# Patient Record
Sex: Female | Born: 2000
Health system: Southern US, Community
[De-identification: ages and names within clinical notes are randomized; demographics above are authoritative.]

## PROBLEM LIST (undated history)

## (undated) DIAGNOSIS — F419 Anxiety disorder, unspecified: Secondary | ICD-10-CM

## (undated) DIAGNOSIS — F988 Other specified behavioral and emotional disorders with onset usually occurring in childhood and adolescence: Secondary | ICD-10-CM

## (undated) DIAGNOSIS — K5190 Ulcerative colitis, unspecified, without complications: Secondary | ICD-10-CM

## (undated) HISTORY — DX: Ulcerative colitis, unspecified, without complications: K51.90

## (undated) HISTORY — DX: Other specified behavioral and emotional disorders with onset usually occurring in childhood and adolescence: F98.8

## (undated) HISTORY — PX: WISDOM TOOTH EXTRACTION: SHX21

---
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
## 2019-01-18 MED FILL — AMPHETAMINE-DEXTROAMPHETAMI: 20 | 30 days supply | Qty: 60 | Fill #0

## 2019-01-18 MED FILL — ESCITALOPRAM 20 MG TABLET: 20 | 90 days supply | Qty: 90 | Fill #0

## 2019-04-25 MED FILL — ESCITALOPRAM 10 MG TABLET: 10 | 30 days supply | Qty: 30 | Fill #3

## 2019-05-13 ENCOUNTER — Encounter: Payer: Self-pay | Admitting: Physician Assistant

## 2019-05-13 ENCOUNTER — Other Ambulatory Visit: Payer: Self-pay

## 2019-05-13 ENCOUNTER — Ambulatory Visit: Payer: 59 | Admitting: Physician Assistant

## 2019-05-13 VITALS — BP 100/62 | HR 78 | Temp 98.1°F | Ht 65.0 in | Wt 129.0 lb

## 2019-05-13 DIAGNOSIS — B079 Viral wart, unspecified: Secondary | ICD-10-CM | POA: Diagnosis not present

## 2019-05-13 DIAGNOSIS — Z23 Encounter for immunization: Secondary | ICD-10-CM

## 2019-05-13 NOTE — Patient Instructions (Signed)
It was great to see you! ° °Cryosurgery post-operative instructions °You have been treated with “cryosurgery.” The treated area should be destroyed by this spray of liquid nitrogen. The care of the site should be as follows: ° °• Within 24 hours after freezing, a small blister may form at the site. This blister may be filled with either clear fluid or bloody fluid. If the blister is causing a great deal of pain, you should cleanse a needle or pin with rubbing alcohol and puncture the blister to drain it. If the blister is not painful, you should just it heal by itself. ° °• No special care other than washing the site with soap and water is necessary. The blister should resolve in approximately five to ten days leaving a small crust at the site. The crust should be allowed to come off on its own. Do not pick at or manipulate the crust. ° °• No bandage is typically needed after cryosurgery. However, if you have punctured a large, tender blister, application of Vaseline ointment and a band-aid may help with any discomfort. ° °• After four weeks if the treated lesion has not resolved, please make an appointment for follow-up evaluation. ° ° °Take care, ° °Adekunle Rohrbach PA-C ° °

## 2019-05-13 NOTE — Progress Notes (Signed)
Diana Perry is a 18 y.o. female here for a new problem.  I acted as a Education administrator for [employer], PA-C Anselmo Pickler, LPN  History of Present Illness:   Chief Complaint  Patient presents with  . Verrucous Vulgaris    HPI   Wart Pt c/o wart on 3 toes of her left foot, has been there for several months. Denies any prior issues with warts. Denies any use of public showers, history of other skin issues. She denies any concerns for fever, drainage of areas.  She has tried OTC wart remover and cryotherapy without improvement.  Past Medical History:  Diagnosis Date  . ADD (attention deficit disorder) 05/2018  . Anxiety      Social History   Socioeconomic History  . Marital status: Single    Spouse name: Not on file  . Number of children: Not on file  . Years of education: Not on file  . Highest education level: Not on file  Occupational History  . Not on file  Social Needs  . Financial resource strain: Not on file  . Food insecurity    Worry: Not on file    Inability: Not on file  . Transportation needs    Medical: Not on file    Non-medical: Not on file  Tobacco Use  . Smoking status: Former Smoker    Types: E-cigarettes    Quit date: 02/20/2018    Years since quitting: 1.2  . Smokeless tobacco: Never Used  Substance and Sexual Activity  . Alcohol use: Never    Frequency: Never  . Drug use: Never  . Sexual activity: Yes    Birth control/protection: Condom  Lifestyle  . Physical activity    Days per week: Not on file    Minutes per session: Not on file  . Stress: Not on file  Relationships  . Social Herbalist on phone: Not on file    Gets together: Not on file    Attends religious service: Not on file    Active member of club or organization: Not on file    Attends meetings of clubs or organizations: Not on file    Relationship status: Not on file  . Intimate partner violence    Fear of current or ex partner: Not on file    Emotionally  abused: Not on file    Physically abused: Not on file    Forced sexual activity: Not on file  Other Topics Concern  . Not on file  Social History Narrative   [employer]   Would like to go to [employer] for [employer]   Boyfriend    History reviewed. No pertinent surgical history.  Family History  Problem Relation Age of Onset  . BRCA 1/2 Mother   . Anxiety disorder Mother     Allergies  Allergen Reactions  . Other     apples    Current Medications:   Current Outpatient Medications:  .  Amphetamine-Dextroamphetamine (ADDERALL PO), Take by mouth., Disp: , Rfl:  .  FLUoxetine (PROZAC) 20 MG tablet, Take 20 mg by mouth daily., Disp: , Rfl:    Review of Systems:   ROS Negative unless otherwise specified per HPI.  Vitals:   Vitals:   05/13/19 1557  BP: 100/62  Pulse: 78  Temp: 98.1 F (36.7 C)  TempSrc: Temporal  SpO2: 97%  Weight: 129 lb (58.5 kg)  Height: '5\' 5"'$  (1.651 m)  Body mass index is 21.47 kg/m.  Physical Exam:   Physical Exam Constitutional:      Appearance: She is well-developed.  HENT:     Head: Normocephalic and atraumatic.  Eyes:     Conjunctiva/sclera: Conjunctivae normal.  Neck:     Musculoskeletal: Normal range of motion and neck supple.  Pulmonary:     Effort: Pulmonary effort is normal.  Musculoskeletal: Normal range of motion.  Skin:    General: Skin is warm and dry.     Comments: Multiple small verrocous skin-colored lesions to tip of 2nd and 3rd L foot toes. Also one lesion to dorsum of L great toe near proximal joint.  Neurological:     Mental Status: She is alert and oriented to person, place, and time.  Psychiatric:        Behavior: Behavior normal.        Thought Content: Thought content normal.        Judgment: Judgment normal.    Consent: Risks and benefits of therapy discussed with patient who voices understanding and agrees with planned care. No barriers to communication or understanding  identified. After obtaining informed consent, the patient's identity, procedure, and site were verified during a pause prior to proceeding with the minor surgical procedure as per universal protocol recommendations. After appropriate cleansing, liquid nitrogen was applied to approximately 10 warts on L foot.    Assessment and Plan:   [name] was seen today for verrucous vulgaris.  Diagnoses and all orders for this visit:  Viral warts, unspecified type Cryotherapy performed in office. Also updated her HPV vaccine, has had only one at age 59. Will repeat in 6 months. Worsening precautions and cryo after-care discussed.  Need for HPV vaccination -     HPV 9-valent vaccine,Recombinat  . Reviewed expectations re: course of current medical issues. . Discussed self-management of symptoms. . Outlined signs and symptoms indicating need for more acute intervention. . Patient verbalized understanding and all questions were answered. . See orders for this visit as documented in the electronic medical record. . Patient received an After-Visit Summary.  CMA or LPN served as scribe during this visit. History, Physical, and Plan performed by medical provider. The above documentation has been reviewed and is accurate and complete.   [name], PA-C

## 2019-06-16 ENCOUNTER — Ambulatory Visit (HOSPITAL_COMMUNITY)
Admission: EM | Admit: 2019-06-16 | Discharge: 2019-06-16 | Disposition: A | Discharge status: Home / Self Care | Payer: 59 | Attending: Emergency Medicine | Admitting: Emergency Medicine

## 2019-06-16 ENCOUNTER — Other Ambulatory Visit: Payer: Self-pay

## 2019-06-16 ENCOUNTER — Inpatient Hospital Stay: Admission: RE | Admit: 2019-06-16 | Payer: 59 | Source: Ambulatory Visit

## 2019-06-16 ENCOUNTER — Encounter (HOSPITAL_COMMUNITY): Payer: Self-pay | Admitting: Emergency Medicine

## 2019-06-16 ENCOUNTER — Telehealth: Payer: Self-pay | Admitting: Physical Therapy

## 2019-06-16 DIAGNOSIS — R42 Dizziness and giddiness: Secondary | ICD-10-CM

## 2019-06-16 DIAGNOSIS — S46819A Strain of other muscles, fascia and tendons at shoulder and upper arm level, unspecified arm, initial encounter: Secondary | ICD-10-CM | POA: Diagnosis not present

## 2019-06-16 MED ORDER — NAPROXEN 500 MG PO TABS: 500.0000 mg | ORAL_TABLET | Freq: Two times a day (BID) | ORAL | 0 refills | Status: DC

## 2019-06-16 MED ORDER — CYCLOBENZAPRINE HCL 5 MG PO TABS: 5.0000 mg | ORAL_TABLET | Freq: Two times a day (BID) | ORAL | 0 refills | Status: DC | PRN

## 2019-06-16 MED FILL — CYCLOBENZAPRINE HCL 5 MG TA: 5 | 6 days supply | Qty: 24 | Fill #0

## 2019-06-16 MED FILL — NAPROXEN 500 MG TABS: 500 | 15 days supply | Qty: 30 | Fill #0
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
## 2019-11-15 ENCOUNTER — Ambulatory Visit (INDEPENDENT_AMBULATORY_CARE_PROVIDER_SITE_OTHER): Payer: 59

## 2019-11-15 ENCOUNTER — Other Ambulatory Visit: Payer: Self-pay

## 2019-11-15 DIAGNOSIS — Z23 Encounter for immunization: Secondary | ICD-10-CM

## 2019-11-15 NOTE — Progress Notes (Signed)
Pt had #3 HPV-gardasil in the office today.

## 2019-12-19 ENCOUNTER — Encounter: Payer: Self-pay | Admitting: Physician Assistant

## 2019-12-19 ENCOUNTER — Telehealth (INDEPENDENT_AMBULATORY_CARE_PROVIDER_SITE_OTHER): Payer: 59 | Admitting: Physician Assistant

## 2019-12-19 VITALS — Ht 65.0 in | Wt 130.0 lb

## 2019-12-19 DIAGNOSIS — J029 Acute pharyngitis, unspecified: Secondary | ICD-10-CM

## 2019-12-19 MED ORDER — AMOXICILLIN 500 MG PO CAPS: 500.0000 mg | ORAL_CAPSULE | Freq: Two times a day (BID) | ORAL | 0 refills | Status: DC

## 2019-12-19 MED FILL — AMOXICILLIN 500 MG CAPSULE: 500 | 10 days supply | Qty: 20 | Fill #0

## 2019-12-19 NOTE — Progress Notes (Signed)
Virtual Visit via Video   I connected with Diana Perry on 12/19/19 at 10:00 AM EDT by a video enabled telemedicine application and verified that I am speaking with the correct person using two identifiers. Location patient: Home Location provider:  [facility], Office Persons participating in the virtual visit: Jesscia L. Erven Colla PA-C, Corky Mull, LPN   I discussed the limitations of evaluation and management by telemedicine and the availability of in person appointments. The patient expressed understanding and agreed to proceed.  I acted as a Neurosurgeon for [employer], [employer], LPN  Subjective:   HPI:  Sore throat Pt c/o sore throat x 3 days, started to get worse yesterday. Having some chills but has not taken temperature. She is having some difficulty swallowing. Taking Ibuprofen with some relief. Pt has hx of strep throat and feels like this is consistent with past episode.  Denies: fatigue, difficulty breathing, stridor, unable to control secretions, neck pain  ROS: See pertinent positives and negatives per HPI.  Patient Active Problem List   Diagnosis Date Noted  . Attention deficit hyperactivity disorder (ADHD) 06/11/2018  . Anxiety 01/20/2017    Social History   Tobacco Use  . Smoking status: Former Smoker    Types: E-cigarettes    Quit date: 02/20/2018    Years since quitting: 1.8  . Smokeless tobacco: Never Used  Substance Use Topics  . Alcohol use: Never    Current Outpatient Medications:  .  Amphetamine-Dextroamphetamine (ADDERALL PO), Take by mouth., Disp: , Rfl:  .  FLUoxetine (PROZAC) 20 MG tablet, Take 20 mg by mouth daily., Disp: , Rfl:  .  amoxicillin (AMOXIL) 500 MG capsule, Take 1 capsule (500 mg total) by mouth 2 (two) times daily., Disp: 20 capsule, Rfl: 0  Allergies  Allergen Reactions  . Other     apples    Objective:   VITALS: Per patient if applicable, see vitals. GENERAL: Alert, appears well and in no  acute distress. HEENT: Atraumatic, conjunctiva clear, no obvious abnormalities on inspection of external nose and ears. NECK: Normal movements of the head and neck. CARDIOPULMONARY: No increased WOB. Speaking in clear sentences. I:E ratio WNL.  MS: Moves all visible extremities without noticeable abnormality. PSYCH: Pleasant and cooperative, well-groomed. Speech normal rate and rhythm. Affect is appropriate. Insight and judgement are appropriate. Attention is focused, linear, and appropriate.  NEURO: CN grossly intact. Oriented as arrived to appointment on time with no prompting. Moves both UE equally.  SKIN: No obvious lesions, wounds, erythema, or cyanosis noted on face or hands.  Assessment and Plan:   [name] was seen today for sore throat.  Diagnoses and all orders for this visit:  Sore throat  Other orders -     amoxicillin (AMOXIL) 500 MG capsule; Take 1 capsule (500 mg total) by mouth 2 (two) times daily.   No red flags on discussion.  Will initiate amoxicillin per orders to empirically cover for strep throat. Discussed taking medications as prescribed. Reviewed return precautions including worsening fever, SOB, worsening cough or other concerns. Push fluids and rest. I recommend that patient follow-up if symptoms worsen or persist despite treatment x 7-10 days, sooner if needed.   . Reviewed expectations re: course of current medical issues. . Discussed self-management of symptoms. . Outlined signs and symptoms indicating need for more acute intervention. . Patient verbalized understanding and all questions were answered. [name] Health Maintenance issues including appropriate healthy diet, exercise, and smoking avoidance were discussed with patient. [name]  See orders for this visit as documented in the electronic medical record.  I discussed the assessment and treatment plan with the patient. The patient was provided an opportunity to ask questions and all were answered. The patient agreed  with the plan and demonstrated an understanding of the instructions.   The patient was advised to call back or seek an in-person evaluation if the symptoms worsen or if the condition fails to improve as anticipated.   CMA or LPN served as scribe during this visit. History, Physical, and Plan performed by medical provider. The above documentation has been reviewed and is accurate and complete.   [name], [place] 12/19/2019

## 2020-02-08 ENCOUNTER — Other Ambulatory Visit: Payer: Self-pay

## 2020-02-08 ENCOUNTER — Telehealth: Payer: Self-pay | Admitting: Physician Assistant

## 2020-02-08 DIAGNOSIS — B070 Plantar wart: Secondary | ICD-10-CM

## 2020-02-08 NOTE — Telephone Encounter (Signed)
Spoke with patient. Podiatry referral placed. Ok per [employer].

## 2020-02-08 NOTE — Telephone Encounter (Signed)
Patient called in and wanted to know if she should be referred to a foot specialist or if there was any that could help with her Plantar warts on her feet. Please advise.

## 2020-02-17 ENCOUNTER — Other Ambulatory Visit (HOSPITAL_COMMUNITY): Payer: Self-pay | Admitting: Adult Health

## 2020-02-17 MED FILL — FLUoxetine HCL 10 MG CAPS: 10 | 90 days supply | Qty: 173 | Fill #0

## 2020-02-22 ENCOUNTER — Encounter: Payer: Self-pay | Admitting: Podiatry

## 2020-02-22 ENCOUNTER — Ambulatory Visit: Payer: 59 | Admitting: Podiatry

## 2020-02-22 ENCOUNTER — Other Ambulatory Visit: Payer: Self-pay

## 2020-02-22 DIAGNOSIS — B070 Plantar wart: Secondary | ICD-10-CM

## 2020-02-22 MED ORDER — FLUOROURACIL 5 % EX CREA: TOPICAL_CREAM | Freq: Two times a day (BID) | CUTANEOUS | 2 refills | Status: DC

## 2020-02-23 MED FILL — FLUOROURACIL 5 % CREA: 5 | 10 days supply | Qty: 40 | Fill #0

## 2020-03-02 DIAGNOSIS — F411 Generalized anxiety disorder: Secondary | ICD-10-CM | POA: Diagnosis not present

## 2020-03-02 DIAGNOSIS — F902 Attention-deficit hyperactivity disorder, combined type: Secondary | ICD-10-CM | POA: Diagnosis not present

## 2020-03-02 DIAGNOSIS — F339 Major depressive disorder, recurrent, unspecified: Secondary | ICD-10-CM | POA: Diagnosis not present

## 2020-03-09 MED FILL — FLUOROURACIL 5 % CREA: 5 | 10 days supply | Qty: 40 | Fill #1

## 2020-03-21 ENCOUNTER — Other Ambulatory Visit: Payer: Self-pay

## 2020-03-21 ENCOUNTER — Encounter: Payer: Self-pay | Admitting: Podiatry

## 2020-03-21 ENCOUNTER — Ambulatory Visit (INDEPENDENT_AMBULATORY_CARE_PROVIDER_SITE_OTHER): Payer: 59 | Admitting: Podiatry

## 2020-03-21 DIAGNOSIS — B070 Plantar wart: Secondary | ICD-10-CM | POA: Diagnosis not present

## 2020-03-21 NOTE — Progress Notes (Signed)
Subjective:   Patient ID: Diana Perry, female   DOB: 19 y.o.   MRN: [id number]   HPI Patient presents with lesions on the bottom of the left foot that have been sore and states is been going on 2 to 3 years and it seems like it is grown quite a bit over that time.  Does not remember any kind of injury or other pathology   Review of Systems  All other systems reviewed and are negative.       Objective:  Physical Exam Vitals and nursing note reviewed.  Constitutional:      Appearance: She is well-developed.  Pulmonary:     Effort: Pulmonary effort is normal.  Musculoskeletal:        General: Normal range of motion.  Skin:    General: Skin is warm.  Neurological:     Mental Status: She is alert.     Neurovascular status intact muscle strength adequate range of motion within normal limits with multiple lesions plantar aspect left that show pinpoint bleeding pain to lateral pressure and measures approximately 30 different lesions     Assessment:  Chronic verruca plantaris left plantar foot with quite a bit of spread     Plan:  H&P reviewed condition did debridement of all lesions exposed the tissue and applied chemical agent to create immune response and begin home Efudex usage.  Explained what to do if any blistering were to occur

## 2020-03-21 NOTE — Progress Notes (Signed)
Subjective:   Patient ID: Diana Perry, female   DOB: 19 y.o.   MRN: [id number]   HPI Patient states still having problems but I am improving   ROS      Objective:  Physical Exam  Neuro vascular status intact with multiple lesions plantar aspect left forefoot that are improving but still present     Assessment:  Verruca plantaris plantar left     Plan:  H&P educated patient debrided the lesions applied medication with sterile dressings instructed what to do if any blistering were to occur and will reappoint if they continue to be a problem

## 2020-05-01 ENCOUNTER — Encounter: Payer: Self-pay | Admitting: Family Medicine

## 2020-05-01 ENCOUNTER — Telehealth (INDEPENDENT_AMBULATORY_CARE_PROVIDER_SITE_OTHER): Payer: 59 | Admitting: Family Medicine

## 2020-05-01 DIAGNOSIS — J029 Acute pharyngitis, unspecified: Secondary | ICD-10-CM

## 2020-05-01 NOTE — Progress Notes (Signed)
Virtual Visit via Video Note  I connected with Diana Perry  on 05/01/20 at  4:20 PM EDT by a video enabled telemedicine application and verified that I am speaking with the correct person using two identifiers.  Location patient: home, [place] Location provider:work or home office Persons participating in the virtual visit: patient, provider  I discussed the limitations of evaluation and management by telemedicine and the availability of in person appointments. The patient expressed understanding and agreed to proceed.   HPI:  Acute visit for sore throat: -started about 3 days ago -symptoms include sore throat, swollen glands in the neck -denies fever, nasal congestion, cough, vomiting, diarrhea, loss of taste or smelling, breathing or swollowing -has a history of strep throat - last case was about 4-5 months ago -a friend of her's who has mono shared a drink with her recently - about 1 week ago -denies any know strep or COVID exposures -reports had covid in the past and is Vaccinated for COVID19   ROS: See pertinent positives and negatives per HPI.  Past Medical History:  Diagnosis Date  . ADD (attention deficit disorder) 05/2018  . Anxiety     History reviewed. No pertinent surgical history.  Family History  Problem Relation Age of Onset  . BRCA 1/2 Mother   . Anxiety disorder Mother     SOCIAL HX: see hpi   Current Outpatient Medications:  .  Amphetamine-Dextroamphetamine (ADDERALL PO), Take 20 mg by mouth daily. , Disp: , Rfl:   EXAM:  VITALS per patient if applicable:  GENERAL: alert, oriented, appears well and in no acute distress  HEENT: atraumatic, conjunttiva clear, no obvious abnormalities on inspection of external nose and ears, on limited vedo visit exam of the oropharynx - no tonsillar exudate or significant tonsillar edema, does have some post oropharyngeal erythema  NECK: normal movements of the head and neck, self reported ant cervical tender glands  LUNGS:  on inspection no signs of respiratory distress, breathing rate appears normal, no obvious gross SOB, gasping or wheezing  CV: no obvious cyanosis  MS: moves all visible extremities without noticeable abnormality  PSYCH/NEURO: pleasant and cooperative, no obvious depression or anxiety, speech and thought processing grossly intact  ASSESSMENT AND PLAN:  Discussed the following assessment and plan:  Sore throat  -we discussed possible serious and likely etiologies, options for evaluation and workup, limitations of telemedicine visit vs in person visit, treatment, treatment risks and precautions. Pt prefers to treat via telemedicine empirically rather then risking or undertaking an in person visit at this moment. Talked through the various potential causes for her symptoms. Given her exposure to mono that is quite likely vs strep vs viral pharyngitis or even COVID19 given a recent uptik in breakthrough cases and high community spread. [facility], covid and potentially mono after this discussion to further evaluate.  Patient agrees to seek prompt in person care if worsening, new symptoms arise, or if is not improving with treatment.   I discussed the assessment and treatment plan with the patient. The patient was provided an opportunity to ask questions and all were answered. The patient agreed with the plan and demonstrated an understanding of the instructions.   The patient was advised to call back or seek an in-person evaluation if the symptoms worsen or if the condition fails to improve as anticipated.   Lucretia Kern, DO

## 2020-06-14 ENCOUNTER — Telehealth: Payer: Self-pay | Admitting: Physician Assistant

## 2020-06-14 NOTE — Telephone Encounter (Signed)
Nurse Assessment Nurse: Gerre Pebbles, RN, Casimiro Needle Date/Time Lamount Cohen Time): 06/13/2020 4:24:38 PM Confirm and document reason for call. If symptomatic, describe symptoms. ---Caller states she has been having abdominal pain for the last 5 months that has gotten worse over the last week. Describes as being over her ovaries. Sharp and quick pain. Rates pain 9/10 and stops her from walking. Comes and goes but last night it was constant for an hour. Does the patient have any new or worsening symptoms? ---Yes Will a triage be completed? ---Yes Related visit to physician within the last 2 weeks? ---No Does the PT have any chronic conditions? (i.e. diabetes, asthma, this includes High risk factors for pregnancy, etc.) ---No Is the patient pregnant or possibly pregnant? (Ask all females between the ages of 60-55) ---No Is this a behavioral health or substance abuse call? ---No Guidelines Guideline Title Affirmed Question Affirmed Notes Nurse Date/Time (Eastern Time) Abdominal Pain - Female [1] MODERATE pain (e.g., interferes with normal activities) AND [2] pain comes and goes (cramps) AND [3] present > 24 hours (Exception: pain with Vomiting or Diarrhea - see that Guideline) Gust Brooms 06/13/2020 4:26:42 PMPLEASE NOTE: All timestamps contained within this report are represented as [place] Standard Time. CONFIDENTIALTY NOTICE: This fax transmission is intended only for the addressee. It contains information that is legally privileged, confidential or otherwise protected from use or disclosure. If you are not the intended recipient, you are strictly prohibited from reviewing, disclosing, copying using or disseminating any of this information or taking any action in reliance on or regarding this information. If you have received this fax in error, please notify us immediately by telephone so that we can arrange for its return to [place]. Phone: [phone], Toll-Free: [phone], Fax:  [phone] Page: 2 of 2 Call Id: [id number] Disp. Time Lamount Cohen Time) Disposition Final User 06/13/2020 4:22:11 PM Send to Urgent Remi Haggard 06/13/2020 4:29:44 PM See PCP within 24 Hours Yes Gerre Pebbles, RN, Dawayne Cirri Disagree/Comply Comply Caller Understands Yes PreDisposition Call Doctor Care Advice Given Per Guideline CALL BACK IF: * Severe pain lasts over 1 hour * Constant pain lasts over 2 hours * You become worse CARE ADVICE given per Abdominal Pain, Female (Adult) guideline. SEE PCP WITHIN 24 HOURS: * IF OFFICE WILL BE OPEN: You need to be examined within the next 24 hours. Call your doctor (or NP/PA) when the office opens and make an appointment. * IF OFFICE WILL BE CLOSED: You need to be seen within the next 24 hours. A clinic or an urgent care center is often a good source of care if your doctor's office is closed or you can't get an appointment. Comments User: Phillips Grout, RN Date/Time Lamount Cohen Time): 06/13/2020 4:32:01 PM Unable to reach office by back line. Urged patient to call after 8am in the morning for appointment. If unable to schedule appointment for office tomorrow should be seen in UC. verbalized understanding. Referrals REFERRED TO PCP OFFIC

## 2020-06-15 ENCOUNTER — Encounter: Payer: Self-pay | Admitting: Physician Assistant

## 2020-06-15 ENCOUNTER — Other Ambulatory Visit (HOSPITAL_COMMUNITY)
Admission: RE | Admit: 2020-06-15 | Discharge: 2020-06-15 | Disposition: A | Discharge status: Home / Self Care | Payer: 59 | Source: Ambulatory Visit | Attending: Physician Assistant | Admitting: Physician Assistant

## 2020-06-15 ENCOUNTER — Other Ambulatory Visit: Payer: Self-pay

## 2020-06-15 ENCOUNTER — Ambulatory Visit (HOSPITAL_COMMUNITY)
Admission: RE | Admit: 2020-06-15 | Discharge: 2020-06-15 | Disposition: A | Discharge status: Home / Self Care | Payer: 59 | Source: Ambulatory Visit | Attending: Physician Assistant | Admitting: Physician Assistant

## 2020-06-15 ENCOUNTER — Ambulatory Visit: Payer: 59 | Admitting: Physician Assistant

## 2020-06-15 VITALS — BP 100/70 | HR 73 | Temp 98.7°F | Ht 65.0 in | Wt 128.2 lb

## 2020-06-15 DIAGNOSIS — R1032 Left lower quadrant pain: Secondary | ICD-10-CM | POA: Diagnosis not present

## 2020-06-15 DIAGNOSIS — Z23 Encounter for immunization: Secondary | ICD-10-CM

## 2020-06-15 DIAGNOSIS — Z113 Encounter for screening for infections with a predominantly sexual mode of transmission: Secondary | ICD-10-CM

## 2020-06-15 DIAGNOSIS — R102 Pelvic and perineal pain: Secondary | ICD-10-CM | POA: Insufficient documentation

## 2020-06-15 DIAGNOSIS — R1030 Lower abdominal pain, unspecified: Secondary | ICD-10-CM | POA: Insufficient documentation

## 2020-06-15 LAB — POCT URINALYSIS DIPSTICK
Bilirubin, UA: NEGATIVE
Blood, UA: NEGATIVE
Glucose, UA: NEGATIVE
Ketones, UA: NEGATIVE
Leukocytes, UA: NEGATIVE
Nitrite, UA: NEGATIVE
Protein, UA: NEGATIVE
Spec Grav, UA: >=1.03 — AB (ref 1.010–1.025)
Urobilinogen, UA: 1 E.U./dL
pH, UA: 6 (ref 5.0–8.0)

## 2020-06-15 LAB — POCT URINE PREGNANCY: Preg Test, Ur: NEGATIVE

## 2020-06-15 NOTE — Patient Instructions (Signed)
It was great to see you!  Let's plan to get a pelvic ultrasound.  I will be in touch with your urine and swab results.      Many things can cause belly (abdominal) pain. Most times, belly pain is not dangerous. Many cases of belly pain can be watched and treated at home. Sometimes belly pain is serious, though. Your doctor will try to find the cause of your belly pain.   Follow these instructions at home:  Take over-the-counter and prescription medicines only as told by your doctor. Do not take medicines that help you poop (laxatives) unless told to by your doctor.  Drink enough fluid to keep your pee (urine) clear or pale yellow.  Watch your belly pain for any changes.  Keep all follow-up visits as told by your doctor. This is important.  Contact a doctor if:  Your belly pain changes or gets worse.  You are not hungry, or you lose weight without trying.  You are having trouble pooping (constipated) or have watery poop (diarrhea) for more than 2-3 days.  You have pain when you pee or poop.  Your belly pain wakes you up at night.  Your pain gets worse with meals, after eating, or with certain foods.  You are throwing up and cannot keep anything down.  You have a fever.  Get help right away if:  Your pain does not go away as soon as your doctor says it should.  You cannot stop throwing up.  Your pain is only in areas of your belly, such as the right side or the left lower part of the belly.  You have bloody or black poop, or poop that looks like tar.  You have very bad pain, cramping, or bloating in your belly.  You have signs of not having enough fluid or water in your body (dehydration), such as: ? Dark pee, very little pee, or no pee. ? Cracked lips. ? Dry mouth. ? Sunken eyes. ? Sleepiness. ? Weakness.

## 2020-06-15 NOTE — Progress Notes (Signed)
Diana Perry is a 19 y.o. female here for a new problem.  I acted as a Education administrator for [employer], PA-C Anselmo Pickler, LPN   History of Present Illness:   Chief Complaint  Patient presents with  . Abdominal Pain    HPI   Abdominal pain Pt c/o LLQ abd pain x 6 months off and on. She also has noticed pain with sexual intercourse. Abdominal pain typically lasts just a few seconds and is 10/10. Does not radiate. Two nights again while she was having sex she had severe constant pain for about one hour.  Denies vaginal bleeding, diarrhea, back pain, vaginal discharge, nausea, vomiting, constipation, rectal bleeding.  She reports that her abdominal pain symptoms overall have worsened with time.  She does keep track of her periods, she states that overall her periods are coming 1-3 days later consistently but are otherwise normal.   Past Medical History:  Diagnosis Date  . ADD (attention deficit disorder) 05/2018  . Anxiety      Social History   Tobacco Use  . Smoking status: Former Smoker    Types: E-cigarettes    Quit date: 02/20/2018    Years since quitting: 2.3  . Smokeless tobacco: Never Used  Vaping Use  . Vaping Use: Every day  Substance Use Topics  . Alcohol use: Never  . Drug use: Never    History reviewed. No pertinent surgical history.  Family History  Problem Relation Age of Onset  . BRCA 1/2 Mother   . Anxiety disorder Mother     Allergies  Allergen Reactions  . Other     apples    Current Medications:   Current Outpatient Medications:  .  Amphetamine-Dextroamphetamine (ADDERALL PO), Take 20 mg by mouth daily. , Disp: , Rfl:    Review of Systems:   ROS  Negative unless otherwise specified per HPI.  Vitals:   Vitals:   06/15/20 1134  BP: 100/70  Pulse: 73  Temp: 98.7 F (37.1 C)  TempSrc: Temporal  SpO2: 97%  Weight: 128 lb 4 oz (58.2 kg)  Height: $Remove'5\' 5"'qsqkylT$  (1.651 m)     Body mass index is 21.34 kg/m.  Physical Exam:   Physical  Exam Vitals and nursing note reviewed.  Constitutional:      General: She is not in acute distress.    Appearance: She is well-developed. She is not ill-appearing or toxic-appearing.  Cardiovascular:     Rate and Rhythm: Normal rate and regular rhythm.     Pulses: Normal pulses.     Heart sounds: Normal heart sounds, S1 normal and S2 normal.     Comments: No LE edema Pulmonary:     Effort: Pulmonary effort is normal.     Breath sounds: Normal breath sounds.  Abdominal:     Tenderness: There is abdominal tenderness in the left lower quadrant. There is guarding. There is no right CVA tenderness or left CVA tenderness. Negative signs include Rovsing's sign.  Skin:    General: Skin is warm and dry.  Neurological:     Mental Status: She is alert.     GCS: GCS eye subscore is 4. GCS verbal subscore is 5. GCS motor subscore is 6.  Psychiatric:        Speech: Speech normal.        Behavior: Behavior normal. Behavior is cooperative.     Results for orders placed or performed in visit on 06/15/20  POCT urinalysis dipstick  Result Value Ref Range  Color, UA amber    Clarity, UA cloudy    Glucose, UA Negative Negative   Bilirubin, UA Negative    Ketones, UA Negative    Spec Grav, UA >=1.030 (A) 1.010 - 1.025   Blood, UA Negative    pH, UA 6.0 5.0 - 8.0   Protein, UA Negative Negative   Urobilinogen, UA 1.0 0.2 or 1.0 E.U./dL   Nitrite, UA Negative    Leukocytes, UA Negative Negative   Appearance     Odor    POCT urine pregnancy  Result Value Ref Range   Preg Test, Ur Negative Negative    Assessment and Plan:   Kersti was seen today for abdominal pain.  Diagnoses and all orders for this visit:  Pelvic pain UA unremarkable. Urine pregnancy test negative. Recommended pelvic exam however she declined -- she was agreeable to self-swab.  She is quite tender on my exam. Will order stat pelvic u/s for further evaluation and management. Labs were obtained. Patient did have a  vago-vasal response during lab draw. She became pale and diaphoretic. She was placed in a wheelchair and rolled to patient room. After lying on exam table for 1-2 minutes, patient verbalized feeling better and states that she was ready to leave. We offered to call her father but she declined. She was coherent and HR returned to normal. Color returned to her face and she was able to ambulate without assistance out of the office. -     POCT urinalysis dipstick -     POCT urine pregnancy -     CBC with Differential/Platelet; Future -     Comprehensive metabolic panel; Future -     Urine Culture -     Cervicovaginal ancillary only( [place]) -     Cancel: US Pelvic Complete With Transvaginal; Future -     Comprehensive metabolic panel -     CBC with Differential/Platelet -     US Pelvic Complete With Transvaginal; Future  Screen for STD (sexually transmitted disease) Update STD testing today. -     Cervicovaginal ancillary only( [place]) -     RPR; Future -     HIV Antibody (routine testing w rflx); Future -     RPR -     HIV Antibody (routine testing w rflx)  Need for immunization against influenza -     Flu Vaccine QUAD 36+ mos IM   CMA or LPN served as scribe during this visit. History, Physical, and Plan performed by medical provider. The above documentation has been reviewed and is accurate and complete.  Time spent with patient today was >30 minutes which consisted of chart review, discussing diagnosis, work up, treatment answering questions and documentation.   [name], PA-C

## 2020-06-16 LAB — URINE CULTURE
MICRO NUMBER:: 10992979
Result:: NO GROWTH
SPECIMEN QUALITY:: ADEQUATE

## 2020-06-18 LAB — HIV ANTIBODY (ROUTINE TESTING W REFLEX): HIV 1&2 Ab, 4th Generation: NONREACTIVE

## 2020-06-18 LAB — CBC WITH DIFFERENTIAL/PLATELET
Absolute Monocytes: 821 cells/uL (ref 200–950)
Basophils Absolute: 48 cells/uL (ref 0–200)
Basophils Relative: 0.7 %
Eosinophils Absolute: 221 cells/uL (ref 15–500)
Eosinophils Relative: 3.2 %
HCT: 41.7 % (ref 35.0–45.0)
Hemoglobin: 14 g/dL (ref 11.7–15.5)
Lymphs Abs: 2001 cells/uL (ref 850–3900)
MCH: 30.4 pg (ref 27.0–33.0)
MCHC: 33.6 g/dL (ref 32.0–36.0)
MCV: 90.7 fL (ref 80.0–100.0)
MPV: 11.7 fL (ref 7.5–12.5)
Monocytes Relative: 11.9 %
Neutro Abs: 3809 cells/uL (ref 1500–7800)
Neutrophils Relative %: 55.2 %
Platelets: 279 10*3/uL (ref 140–400)
RBC: 4.6 10*6/uL (ref 3.80–5.10)
RDW: 12.8 % (ref 11.0–15.0)
Total Lymphocyte: 29 %
WBC: 6.9 10*3/uL (ref 3.8–10.8)

## 2020-06-18 LAB — COMPREHENSIVE METABOLIC PANEL
AG Ratio: 1.7 (calc) (ref 1.0–2.5)
ALT: 11 U/L (ref 5–32)
AST: 17 U/L (ref 12–32)
Albumin: 4.6 g/dL (ref 3.6–5.1)
Alkaline phosphatase (APISO): 43 U/L (ref 36–128)
BUN: 13 mg/dL (ref 7–20)
CO2: 25 mmol/L (ref 20–32)
Calcium: 9.7 mg/dL (ref 8.9–10.4)
Chloride: 103 mmol/L (ref 98–110)
Creat: 0.72 mg/dL (ref 0.50–1.00)
Globulin: 2.7 g/dL (calc) (ref 2.0–3.8)
Glucose, Bld: 88 mg/dL (ref 65–99)
Potassium: 3.8 mmol/L (ref 3.8–5.1)
Sodium: 138 mmol/L (ref 135–146)
Total Bilirubin: 0.9 mg/dL (ref 0.2–1.1)
Total Protein: 7.3 g/dL (ref 6.3–8.2)

## 2020-06-18 LAB — CERVICOVAGINAL ANCILLARY ONLY
Bacterial Vaginitis (gardnerella): NEGATIVE
Candida Glabrata: NEGATIVE
Candida Vaginitis: NEGATIVE
Chlamydia: NEGATIVE
Comment: NEGATIVE
Comment: NEGATIVE
Comment: NEGATIVE
Comment: NEGATIVE
Comment: NEGATIVE
Comment: NORMAL
Neisseria Gonorrhea: NEGATIVE
Trichomonas: NEGATIVE

## 2020-06-18 LAB — RPR: RPR Ser Ql: NONREACTIVE

## 2020-08-09 ENCOUNTER — Encounter (HOSPITAL_COMMUNITY): Payer: Self-pay

## 2020-08-09 ENCOUNTER — Other Ambulatory Visit: Payer: Self-pay

## 2020-08-09 ENCOUNTER — Ambulatory Visit (HOSPITAL_COMMUNITY)
Admission: EM | Admit: 2020-08-09 | Discharge: 2020-08-09 | Disposition: A | Discharge status: Home / Self Care | Payer: 59 | Attending: Urgent Care | Admitting: Urgent Care

## 2020-08-09 DIAGNOSIS — M545 Low back pain, unspecified: Secondary | ICD-10-CM | POA: Diagnosis not present

## 2020-08-09 DIAGNOSIS — S39012A Strain of muscle, fascia and tendon of lower back, initial encounter: Secondary | ICD-10-CM

## 2020-08-09 MED ORDER — NAPROXEN 500 MG PO TABS: 500.0000 mg | ORAL_TABLET | Freq: Two times a day (BID) | ORAL | 0 refills | Status: DC

## 2020-08-09 MED ORDER — TIZANIDINE HCL 4 MG PO TABS: 4.0000 mg | ORAL_TABLET | Freq: Three times a day (TID) | ORAL | 0 refills | Status: DC | PRN
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
## 2020-08-10 MED FILL — NAPROXEN 500 MG TABS: 500 | 15 days supply | Qty: 30 | Fill #0

## 2020-08-10 MED FILL — tiZANidine HCL 4 MG TABS: 4 | 10 days supply | Qty: 30 | Fill #0

## 2020-10-25 MED FILL — FLUoxetine HCL 10 MG CAPS: 10 | 90 days supply | Qty: 180 | Fill #1

## 2020-10-29 ENCOUNTER — Other Ambulatory Visit: Payer: Self-pay | Admitting: Adult Health

## 2020-10-29 DIAGNOSIS — F411 Generalized anxiety disorder: Secondary | ICD-10-CM

## 2020-10-29 DIAGNOSIS — F909 Attention-deficit hyperactivity disorder, unspecified type: Secondary | ICD-10-CM

## 2020-10-29 MED ORDER — FLUOXETINE HCL 20 MG PO CAPS: 20.0000 mg | ORAL_CAPSULE | Freq: Every day | ORAL | 0 refills | Status: DC

## 2020-10-29 MED ORDER — AMPHETAMINE-DEXTROAMPHETAMINE 20 MG PO TABS: 20.0000 mg | ORAL_TABLET | Freq: Every day | ORAL | 0 refills | Status: DC

## 2020-10-29 MED FILL — AMPHETAMINE-DEXTROAMPHETAMI: 20 | 90 days supply | Qty: 90 | Fill #0

## 2021-02-14 ENCOUNTER — Emergency Department (INDEPENDENT_AMBULATORY_CARE_PROVIDER_SITE_OTHER)
Admission: EM | Admit: 2021-02-14 | Discharge: 2021-02-14 | Disposition: A | Discharge status: Home / Self Care | Payer: 59 | Source: Home / Self Care

## 2021-02-14 ENCOUNTER — Other Ambulatory Visit: Payer: Self-pay

## 2021-02-14 ENCOUNTER — Encounter: Payer: Self-pay | Admitting: Emergency Medicine

## 2021-02-14 DIAGNOSIS — J029 Acute pharyngitis, unspecified: Secondary | ICD-10-CM | POA: Diagnosis not present

## 2021-02-14 LAB — POCT RAPID STREP A (OFFICE): Rapid Strep A Screen: NEGATIVE

## 2021-02-14 NOTE — ED Triage Notes (Signed)
Left work early yesterday for sore throat & body aches OTC advil yesterday  Denies chills

## 2021-02-14 NOTE — Discharge Instructions (Signed)
Drink plenty of fluids.  Return if any problems.   

## 2021-02-15 LAB — CULTURE, GROUP A STREP

## 2021-02-15 NOTE — ED Provider Notes (Signed)
[facility]    CSN: [id number] Arrival date & time: 02/14/21  1341      History   Chief Complaint Chief Complaint  Patient presents with  . Sore Throat    HPI Diana Perry is a 20 y.o. female.   The history is provided by the patient. No language interpreter was used.  Sore Throat This is a new problem. The current episode started yesterday. The problem occurs constantly. The problem has not changed since onset.Nothing aggravates the symptoms. Nothing relieves the symptoms. She has tried nothing for the symptoms. The treatment provided no relief.  Pt complains of a sore throat.   Past Medical History:  Diagnosis Date  . ADD (attention deficit disorder) 05/2018  . Anxiety     Patient Active Problem List   Diagnosis Date Noted  . Attention deficit hyperactivity disorder (ADHD) 06/11/2018  . Anxiousness 01/20/2017    History reviewed. No pertinent surgical history.  OB History   No obstetric history on file.      Home Medications    Prior to Admission medications   Medication Sig Start Date End Date Taking? Authorizing Provider  vortioxetine HBr (TRINTELLIX) 10 MG TABS tablet Take 10 mg by mouth daily.   Yes [provider]  amphetamine-dextroamphetamine (ADDERALL) 20 MG tablet TAKE 1 TABLET BY MOUTH ONCE A DAY Patient not taking: Reported on 02/14/2021 10/29/20 04/27/21  Mozingo, Regina Nattalie, NP  FLUoxetine (PROZAC) 10 MG capsule TAKE 1 CAPSULE BY MOUTH ONCE DAILY FOR 7 DAYS THEN TAKE 2 CAPSULES DAILY Patient not taking: Reported on 02/14/2021 02/17/20 02/16/21  Mozingo, Regina Nattalie, NP  FLUoxetine (PROZAC) 20 MG capsule TAKE 1 CAPSULE BY MOUTH ONCE A DAY Patient not taking: Reported on 02/14/2021 10/29/20 10/29/21  Mozingo, Regina Nattalie, NP  naproxen (NAPROSYN) 500 MG tablet Take 1 tablet (500 mg total) by mouth 2 (two) times daily with a meal. Patient not taking: Reported on 02/14/2021 08/09/20   Mani, Mario, PA-C  tiZANidine (ZANAFLEX) 4  MG tablet Take 1 tablet (4 mg total) by mouth every 8 (eight) hours as needed. Patient not taking: Reported on 02/14/2021 08/09/20   Mani, Mario, PA-C    Family History Family History  Problem Relation Age of Onset  . BRCA 1/2 Mother   . Anxiety disorder Mother   . Healthy Father     Social History Social History   Tobacco Use  . Smoking status: Current Every Day Smoker    Types: E-cigarettes  . Smokeless tobacco: Never Used  Vaping Use  . Vaping Use: Every day  . Substances: Nicotine, Flavoring  Substance Use Topics  . Alcohol use: Never  . Drug use: Never     Allergies   Other   Review of Systems Review of Systems  All other systems reviewed and are negative.    Physical Exam Triage Vital Signs ED Triage Vitals  Enc Vitals Group     BP 02/14/21 1404 102/70     Pulse Rate 02/14/21 1404 77     Resp 02/14/21 1404 15     Temp 02/14/21 1404 98.5 F (36.9 C)     Temp Source 02/14/21 1404 Oral     SpO2 02/14/21 1404 98 %     Weight --      Height --      Head Circumference --      Peak Flow --      Pain Score 02/14/21 1407 4     Pain Loc --        Pain Edu? --      Excl. in [place]? --    No data found.  Updated Vital Signs BP 102/70 (BP Location: Right Arm)   Pulse 77   Temp 98.5 F (36.9 C) (Oral)   Resp 15   LMP 01/31/2021 (Exact Date)   SpO2 98%   Visual Acuity Right Eye Distance:   Left Eye Distance:   Bilateral Distance:    Right Eye Near:   Left Eye Near:    Bilateral Near:     Physical Exam Vitals and nursing note reviewed.  Constitutional:      Appearance: She is well-developed.  HENT:     Head: Normocephalic.     Mouth/Throat:     Pharynx: Posterior oropharyngeal erythema present.  Cardiovascular:     Rate and Rhythm: Normal rate.  Pulmonary:     Effort: Pulmonary effort is normal.  Abdominal:     General: There is no distension.  Musculoskeletal:        General: Normal range of motion.     Cervical back: Normal range of  motion.  Skin:    General: Skin is warm.  Neurological:     Mental Status: She is alert and oriented to person, place, and time.  Psychiatric:        Mood and Affect: Mood normal.      UC Treatments / Results  Labs (all labs ordered are listed, but only abnormal results are displayed) Labs Reviewed  CULTURE, GROUP A STREP  POCT RAPID STREP A (OFFICE)    EKG   Radiology No results found.  Procedures Procedures (including critical care time)  Medications Ordered in UC Medications - No data to display  Initial Impression / Assessment and Plan / UC Course  I have reviewed the triage vital signs and the nursing notes.  Pertinent labs & imaging results that were available during my care of the patient were reviewed by me and considered in my medical decision making (see chart for details).     Strep is negative  covid pending Final Clinical Impressions(s) / UC Diagnoses   Final diagnoses:  Pharyngitis, unspecified etiology     Discharge Instructions     Drink plenty of fluids.  Return if any problems.    ED Prescriptions    None     PDMP not reviewed this encounter.  An After Visit Summary was printed and given to the patient.    Fransico Meadow, [place] 02/15/21 1332

## 2021-05-15 ENCOUNTER — Other Ambulatory Visit (HOSPITAL_COMMUNITY): Payer: Self-pay

## 2021-05-16 ENCOUNTER — Other Ambulatory Visit: Payer: Self-pay | Admitting: Adult Health

## 2021-05-16 ENCOUNTER — Other Ambulatory Visit (HOSPITAL_COMMUNITY): Payer: Self-pay

## 2021-05-16 DIAGNOSIS — F909 Attention-deficit hyperactivity disorder, unspecified type: Secondary | ICD-10-CM

## 2021-05-17 ENCOUNTER — Other Ambulatory Visit (HOSPITAL_COMMUNITY): Payer: Self-pay

## 2021-05-17 NOTE — Telephone Encounter (Signed)
Please schedule appt

## 2021-05-20 ENCOUNTER — Other Ambulatory Visit (HOSPITAL_COMMUNITY): Payer: Self-pay

## 2021-05-20 ENCOUNTER — Other Ambulatory Visit: Payer: Self-pay | Admitting: Adult Health

## 2021-05-20 DIAGNOSIS — F909 Attention-deficit hyperactivity disorder, unspecified type: Secondary | ICD-10-CM

## 2021-05-21 ENCOUNTER — Other Ambulatory Visit (HOSPITAL_COMMUNITY): Payer: Self-pay

## 2021-05-21 MED ORDER — AMPHETAMINE-DEXTROAMPHETAMINE 20 MG PO TABS
ORAL_TABLET | Freq: Every day | ORAL | 0 refills | Status: DC
  Filled 2021-05-21: qty 90, 90d supply, fill #0

## 2021-05-21 NOTE — Telephone Encounter (Signed)
Last filled 10/29/20 appt on 9/1

## 2021-05-23 ENCOUNTER — Ambulatory Visit (INDEPENDENT_AMBULATORY_CARE_PROVIDER_SITE_OTHER): Payer: 59 | Admitting: Adult Health

## 2021-05-23 DIAGNOSIS — F909 Attention-deficit hyperactivity disorder, unspecified type: Secondary | ICD-10-CM

## 2021-05-23 DIAGNOSIS — F411 Generalized anxiety disorder: Secondary | ICD-10-CM

## 2021-05-23 NOTE — Progress Notes (Signed)
Rickell L. Fratus [id number] [birth year] 19 y.o.  Subjective:   Patient ID:  Diana Perry is a 20 y.o. (DOB 02-11-2001) female.  Chief Complaint: No chief complaint on file.   HPI Diana Perry presents to the office today for follow-up of ADHD and GAD  Describes mood today as "ok". Pleasant. Mood symptoms - denies depression and anxiety. Reports more irritability overall. Stating "I'm doing alright". Feels like current medications - Adderall and Trintellix continue to work well. Stable interest and motivation. Taking medications as prescribed.  Energy levels stable. Active, does not have a regular exercise routine.  Enjoys some usual interests and activities. Single. Lives with parents and 2 brothers. Spending time with family. Appetite adequate. Weight stable. Sleeps well most nights. Averages 6 to 8 hours. Focus and concentration stable. Completing tasks. Managing aspects of household. Full time student. Works 2 jobs.  Denies SI or HI.  Denies AH or VH.  Previous medication trials:  Prozac   GAD-7    Flowsheet Row Office Visit from 06/11/2018 in [facility]  Total GAD-7 Score 12      PHQ2-9    Flowsheet Row Office Visit from 06/11/2018 in [facility]  PHQ-2 Total Score 0      Flowsheet Row ED from 02/14/2021 in [facility] RISK CATEGORY No Risk        Review of Systems:  Review of Systems  Musculoskeletal:  Negative for gait problem.  Neurological:  Negative for tremors.  Psychiatric/Behavioral:         Please refer to HPI   Medications: I have reviewed the patient's current medications.  Current Outpatient Medications  Medication Sig Dispense Refill   amphetamine-dextroamphetamine (ADDERALL) 20 MG tablet TAKE 1 TABLET BY MOUTH ONCE A DAY 90 tablet 0   FLUoxetine (PROZAC) 10 MG capsule TAKE 1 CAPSULE BY MOUTH ONCE DAILY FOR 7 DAYS THEN TAKE 2 CAPSULES DAILY (Patient not  taking: Reported on 02/14/2021) 180 capsule 1   FLUoxetine (PROZAC) 20 MG capsule TAKE 1 CAPSULE BY MOUTH ONCE A DAY (Patient not taking: Reported on 02/14/2021) 90 capsule 0   naproxen (NAPROSYN) 500 MG tablet Take 1 tablet (500 mg total) by mouth 2 (two) times daily with a meal. (Patient not taking: Reported on 02/14/2021) 30 tablet 0   tiZANidine (ZANAFLEX) 4 MG tablet Take 1 tablet (4 mg total) by mouth every 8 (eight) hours as needed. (Patient not taking: Reported on 02/14/2021) 30 tablet 0   vortioxetine HBr (TRINTELLIX) 10 MG TABS tablet Take 10 mg by mouth daily.     No current facility-administered medications for this visit.    Medication Side Effects: None  Allergies:  Allergies  Allergen Reactions   Other     apples    Past Medical History:  Diagnosis Date   ADD (attention deficit disorder) 05/2018   Anxiety     Past Medical History, Surgical history, Social history, and Family history were reviewed and updated as appropriate.   Please see review of systems for further details on the patient's review from today.   Objective:   Physical Exam:  There were no vitals taken for this visit.  Physical Exam Constitutional:      General: Diana Perry is not in acute distress. Musculoskeletal:        General: No deformity.  Neurological:     Mental Status: Diana Perry is alert and oriented to person, place, and time.     Coordination:  Coordination normal.  Psychiatric:        Attention and Perception: Attention and perception normal. Diana Perry does not perceive auditory or visual hallucinations.        Mood and Affect: Mood normal. Mood is not anxious or depressed. Affect is not labile, blunt, angry or inappropriate.        Speech: Speech normal.        Behavior: Behavior normal.        Thought Content: Thought content normal. Thought content is not paranoid or delusional. Thought content does not include homicidal or suicidal ideation. Thought content does not include homicidal or suicidal  plan.        Cognition and Memory: Cognition and memory normal.        Judgment: Judgment normal.     Comments: Insight intact    Lab Review:     Component Value Date/Time   NA 138 06/15/2020 1201   K 3.8 06/15/2020 1201   CL 103 06/15/2020 1201   CO2 25 06/15/2020 1201   GLUCOSE 88 06/15/2020 1201   BUN 13 06/15/2020 1201   CREATININE 0.72 06/15/2020 1201   CALCIUM 9.7 06/15/2020 1201   PROT 7.3 06/15/2020 1201   ALBUMIN 4.7 06/11/2018 1024   AST 17 06/15/2020 1201   ALT 11 06/15/2020 1201   ALKPHOS 50 06/11/2018 1024   BILITOT 0.9 06/15/2020 1201   GFRNONAA 124 09/04/2018 0527   GFRAA 144 09/04/2018 0527       Component Value Date/Time   WBC 6.9 06/15/2020 1201   RBC 4.60 06/15/2020 1201   HGB 14.0 06/15/2020 1201   HCT 41.7 06/15/2020 1201   PLT 279 06/15/2020 1201   MCV 90.7 06/15/2020 1201   MCH 30.4 06/15/2020 1201   MCHC 33.6 06/15/2020 1201   RDW 12.8 06/15/2020 1201   LYMPHSABS 2,001 06/15/2020 1201   EOSABS 221 06/15/2020 1201   BASOSABS 48 06/15/2020 1201    No results found for: POCLITH, LITHIUM   No results found for: PHENYTOIN, PHENOBARB, VALPROATE, CBMZ   .res Assessment: Plan:    Plan:  PDMP reviewed  Continue Adderall mg daily Continue Trintellix 10mg  daily  RTC 4 weeks months  Patient advised to contact office with any questions, adverse effects, or acute worsening in signs and symptoms.    Discussed potential benefits, risks, and side effects of stimulants with patient to include increased heart rate, palpitations, insomnia, increased anxiety, increased irritability, or decreased appetite.  Instructed patient to contact office if experiencing any significant tolerability issues.   Diagnoses and all orders for this visit:  Attention deficit hyperactivity disorder (ADHD), unspecified ADHD type  Generalized anxiety disorder    Please see After Visit Summary for patient specific instructions.  No future appointments.   No  orders of the defined types were placed in this encounter.   -------------------------------

## 2021-05-24 ENCOUNTER — Encounter: Payer: Self-pay | Admitting: Adult Health

## 2021-11-25 ENCOUNTER — Other Ambulatory Visit: Payer: Self-pay | Admitting: Adult Health

## 2021-11-25 DIAGNOSIS — F909 Attention-deficit hyperactivity disorder, unspecified type: Secondary | ICD-10-CM

## 2021-11-25 NOTE — Telephone Encounter (Signed)
Called patient. She has only been seen once, 05/23/21. She needs an appt. LVM to [facility].  ?

## 2021-11-26 ENCOUNTER — Other Ambulatory Visit (HOSPITAL_COMMUNITY): Payer: Self-pay

## 2021-11-26 ENCOUNTER — Other Ambulatory Visit: Payer: Self-pay | Admitting: Adult Health

## 2021-11-26 DIAGNOSIS — F909 Attention-deficit hyperactivity disorder, unspecified type: Secondary | ICD-10-CM

## 2021-11-26 MED ORDER — AMPHETAMINE-DEXTROAMPHETAMINE 20 MG PO TABS
ORAL_TABLET | Freq: Every day | ORAL | 0 refills | Status: DC
  Filled 2021-11-26: qty 90, fill #0
  Filled 2022-01-04: qty 90, 90d supply, fill #0

## 2021-12-02 ENCOUNTER — Other Ambulatory Visit (HOSPITAL_COMMUNITY): Payer: Self-pay

## 2021-12-31 ENCOUNTER — Other Ambulatory Visit (HOSPITAL_COMMUNITY): Payer: Self-pay

## 2022-01-04 ENCOUNTER — Other Ambulatory Visit (HOSPITAL_COMMUNITY): Payer: Self-pay

## 2022-06-16 ENCOUNTER — Encounter: Payer: Self-pay | Admitting: *Deleted

## 2022-06-16 ENCOUNTER — Other Ambulatory Visit: Payer: Self-pay | Admitting: Adult Health

## 2022-06-16 DIAGNOSIS — F909 Attention-deficit hyperactivity disorder, unspecified type: Secondary | ICD-10-CM

## 2022-06-16 NOTE — Telephone Encounter (Signed)
Please call patient to schedule an appt, last seen 05/2021.

## 2022-06-17 NOTE — Telephone Encounter (Signed)
Lvm for pt to call and schedule

## 2022-06-19 ENCOUNTER — Other Ambulatory Visit (HOSPITAL_COMMUNITY): Payer: Self-pay

## 2022-06-19 MED ORDER — AMPHETAMINE-DEXTROAMPHETAMINE 20 MG PO TABS
ORAL_TABLET | Freq: Every day | ORAL | 0 refills | Status: DC
  Filled 2022-06-19: qty 90, 90d supply, fill #0

## 2022-06-23 IMAGING — US US PELVIS COMPLETE WITH TRANSVAGINAL
1 series · 14 of 25 positions shown · non-contrast
Comparison: None

CLINICAL DATA: Left lower quadrant pain



[Series 1: us pelvis complete with transvaginal · 14 of 43 slices shown]
[im 1/43]
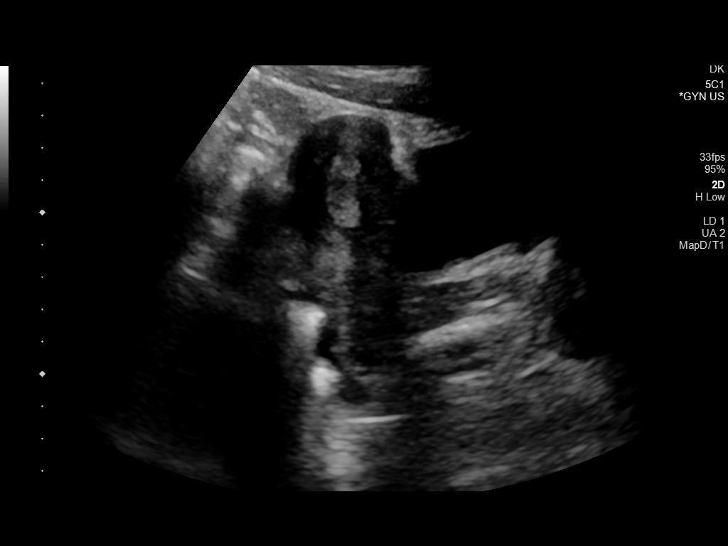
[im 4/43]
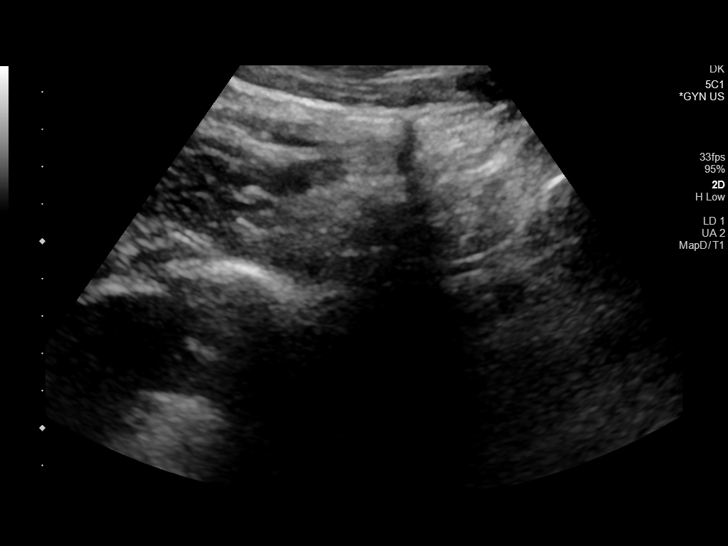
[im 8/43]
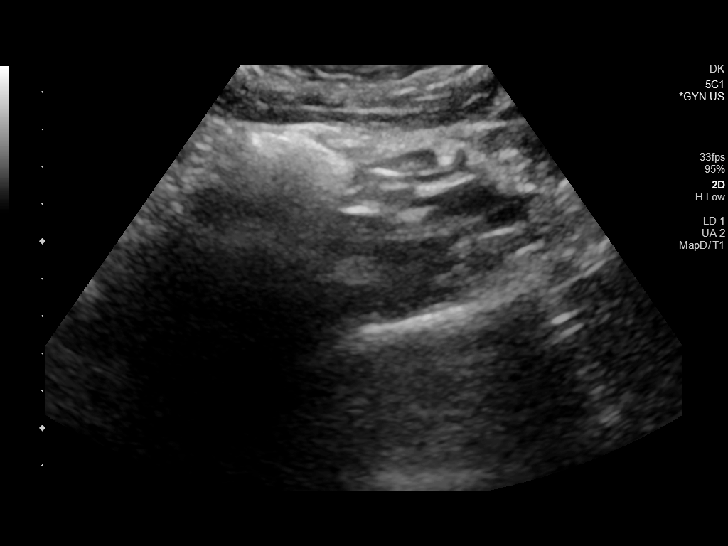
[im 11/43]
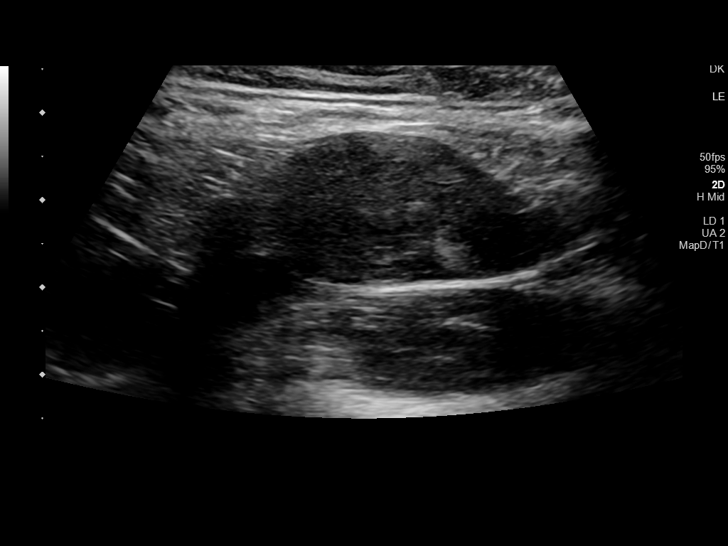
[im 15/43]
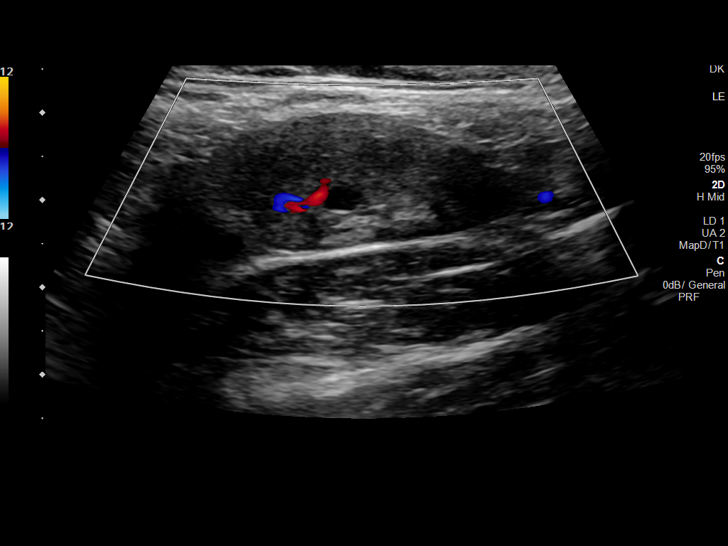
[im 16/43]
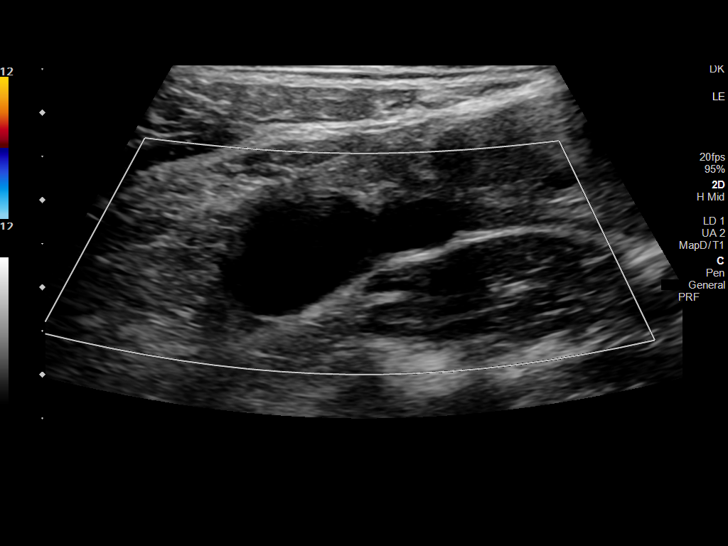
[im 20/43]
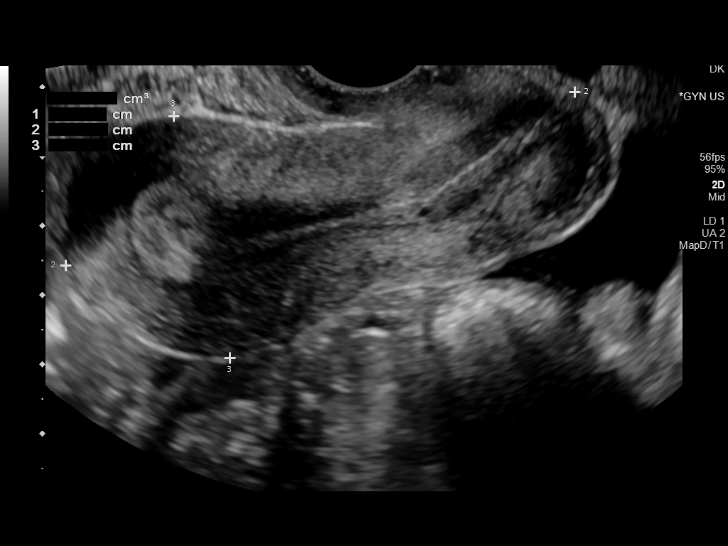
[im 23/43]
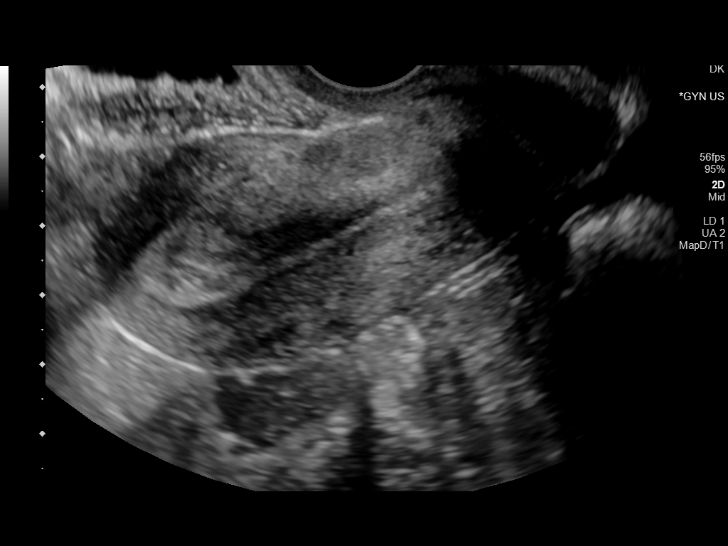
[im 27/43]
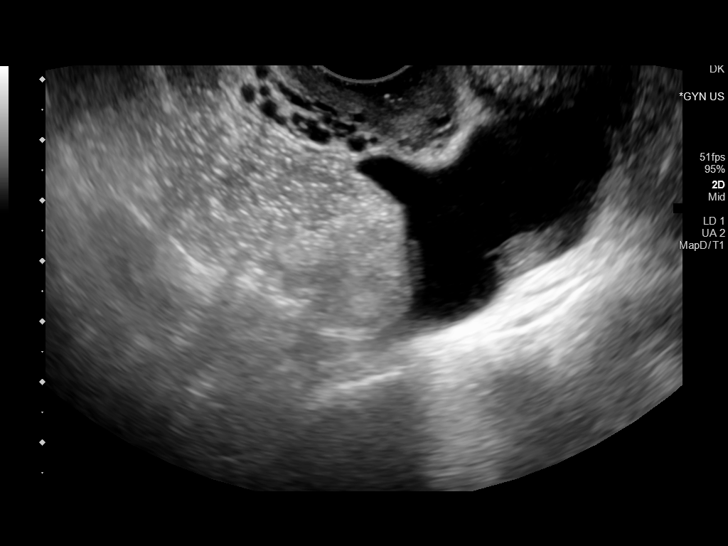
[im 29/43]
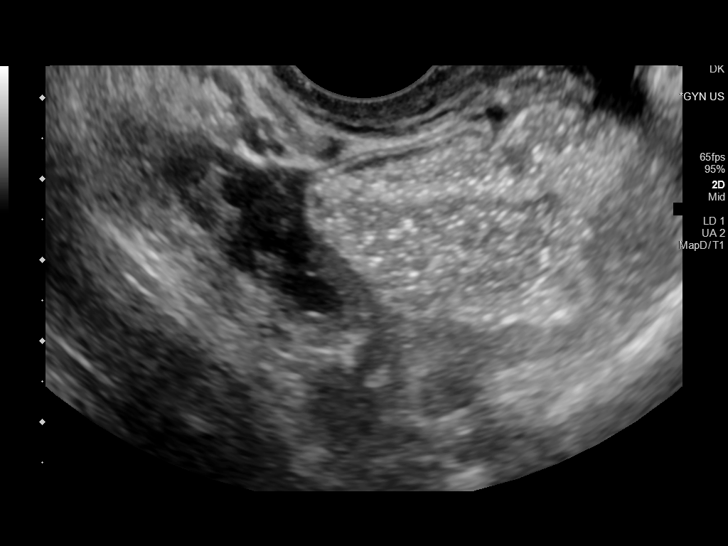
[im 32/43]
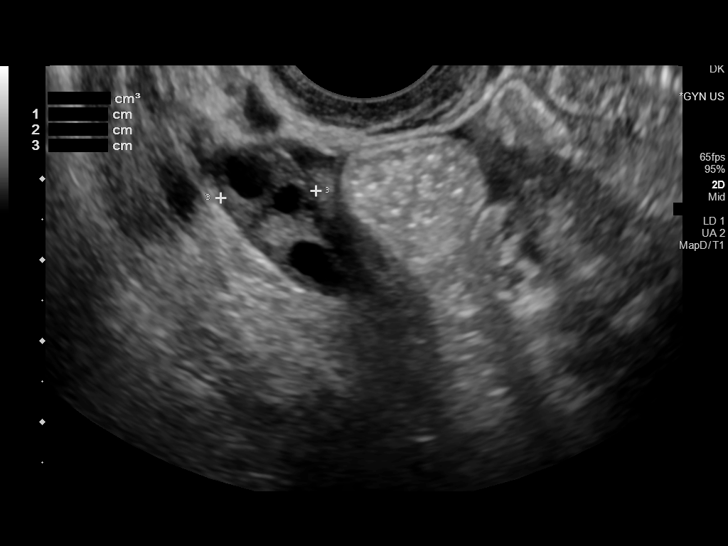
[im 36/43]
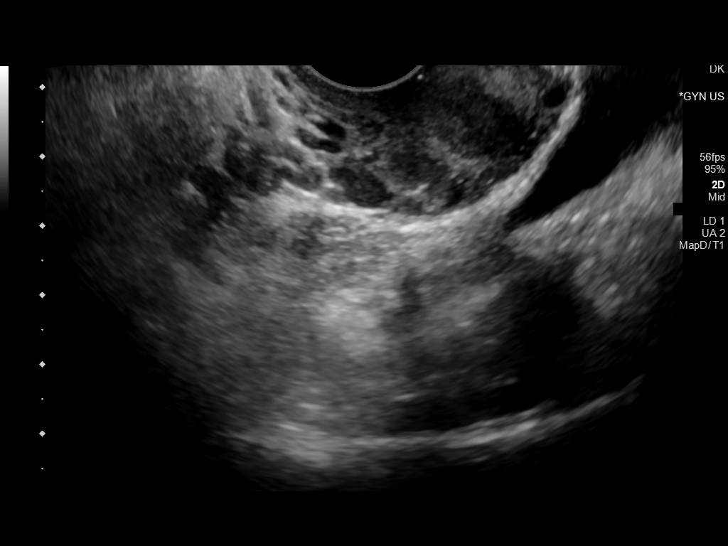
[im 39/43]
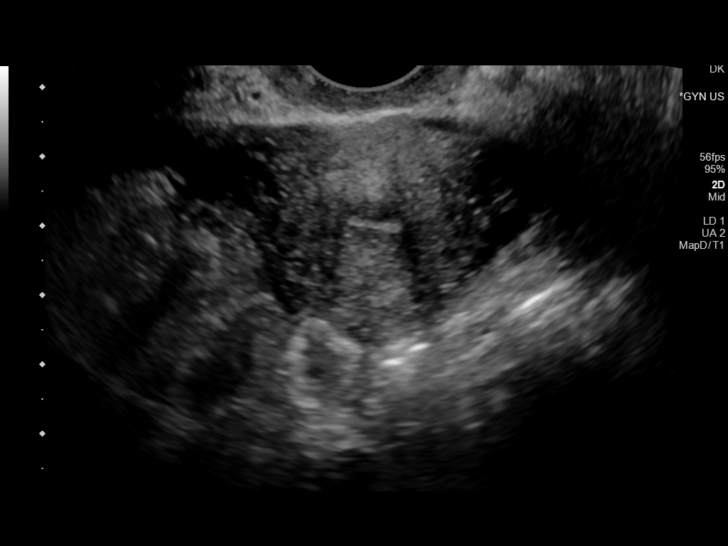
[im 43/43]
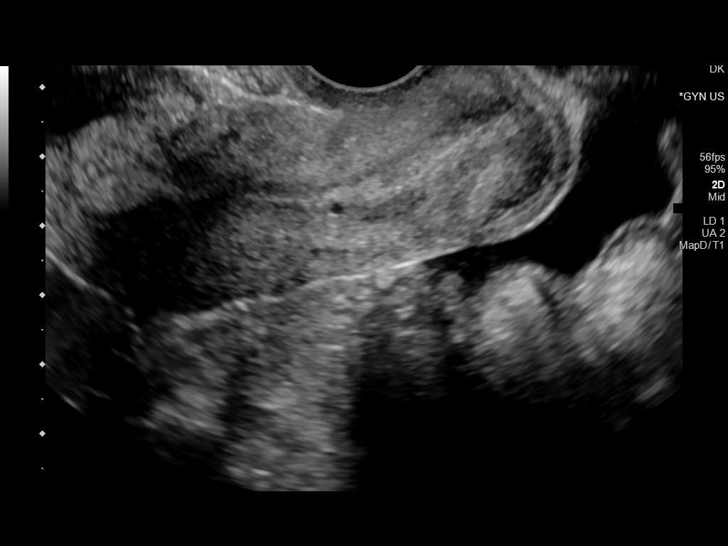

[14 of 25 positions shown; findings below may reference images not displayed]

FINDINGS: Uterus

Measurements: 7.8 x 3.6 x 4.5 cm = volume: 63 mL. No fibroids or
other mass visualized.

Endometrium

Thickness: 13 mm.  No focal abnormality visualized.

Right ovary

Measurements: 3.4 x 1.2 x 1.2 cm = volume: 2.4 mL. Normal
appearance/no adnexal mass.

Left ovary

Measurements: 3.6 x 1.3 x 2.5 cm = volume: 6 mL. Normal
appearance/no adnexal mass.

Other findings

There is a small volume of pelvic free fluid, likely physiologic.
IMPRESSION: Unremarkable exam.  No findings to explain the patient's symptoms.

## 2022-06-24 ENCOUNTER — Other Ambulatory Visit (HOSPITAL_COMMUNITY): Payer: Self-pay

## 2022-06-24 ENCOUNTER — Other Ambulatory Visit: Payer: Self-pay | Admitting: Adult Health

## 2022-06-24 DIAGNOSIS — F909 Attention-deficit hyperactivity disorder, unspecified type: Secondary | ICD-10-CM

## 2022-06-24 MED ORDER — AMPHETAMINE-DEXTROAMPHETAMINE 20 MG PO TABS
20.0000 mg | ORAL_TABLET | Freq: Every day | ORAL | 0 refills | Status: DC
  Filled 2022-07-01: qty 90, 90d supply, fill #0

## 2022-06-27 ENCOUNTER — Other Ambulatory Visit (HOSPITAL_COMMUNITY): Payer: Self-pay

## 2022-07-01 ENCOUNTER — Other Ambulatory Visit (HOSPITAL_COMMUNITY): Payer: Self-pay

## 2022-07-15 ENCOUNTER — Other Ambulatory Visit (HOSPITAL_COMMUNITY): Payer: Self-pay

## 2022-09-03 ENCOUNTER — Ambulatory Visit: Payer: 59 | Admitting: Adult Health

## 2022-09-04 ENCOUNTER — Encounter: Payer: Self-pay | Admitting: *Deleted

## 2022-09-26 ENCOUNTER — Encounter: Payer: Self-pay | Admitting: Adult Health

## 2022-09-26 ENCOUNTER — Telehealth: Payer: 59 | Admitting: Adult Health

## 2022-09-26 ENCOUNTER — Other Ambulatory Visit (HOSPITAL_COMMUNITY): Payer: Self-pay

## 2022-09-26 DIAGNOSIS — F909 Attention-deficit hyperactivity disorder, unspecified type: Secondary | ICD-10-CM | POA: Diagnosis not present

## 2022-09-26 DIAGNOSIS — F4010 Social phobia, unspecified: Secondary | ICD-10-CM | POA: Diagnosis not present

## 2022-09-26 MED ORDER — AMPHETAMINE-DEXTROAMPHETAMINE 20 MG PO TABS
20.0000 mg | ORAL_TABLET | Freq: Every day | ORAL | 0 refills | Status: DC
  Filled 2022-09-26 – 2022-12-29 (×2): qty 90, 90d supply, fill #0

## 2022-09-26 MED ORDER — SERTRALINE HCL 50 MG PO TABS
50.0000 mg | ORAL_TABLET | Freq: Every day | ORAL | 0 refills | Status: DC
Start: 2022-09-26 — End: 2023-08-26
  Filled 2022-09-26 – 2022-10-07 (×2): qty 90, 90d supply, fill #0

## 2022-09-26 NOTE — Progress Notes (Signed)
Lucile L. Pinho [id number] 27-Feb-2001 22 y.o.  Virtual Visit via Video Note  I connected with pt @ on 09/26/22 at 10:20 AM EST by a video enabled telemedicine application and verified that I am speaking with the correct person using two identifiers.   I discussed the limitations of evaluation and management by telemedicine and the availability of in person appointments. The patient expressed understanding and agreed to proceed.  I discussed the assessment and treatment plan with the patient. The patient was provided an opportunity to ask questions and all were answered. The patient agreed with the plan and demonstrated an understanding of the instructions.   The patient was advised to call back or seek an in-person evaluation if the symptoms worsen or if the condition fails to improve as anticipated.  I provided 15 minutes of non-face-to-face time during this encounter.  The patient was located at home.  The provider was located at [place].   Aloha Gell, NP   Subjective:   Patient ID:  Diana Perry is a 22 y.o. (DOB 06-07-2001) female.  Chief Complaint: No chief complaint on file.   HPI Diana Perry presents for follow-up of ADHD and GAD  Describes mood today as "ok". Pleasant. Denies tearfulness. Mood symptoms - reports some depression, anxiety, and irritability. Reports some worry and over thinking. Reports social anxiety. Mood is variable. Stating "I feel like my anxiety makes me irritable". Felt like the Trintellix was helpful, but could not tolerate the nausea - willing to consider other options. Feels like the Adderall continues to work well to manage ADD symptoms. Stable interest and motivation. Taking medications as prescribed.  Energy levels stable. Active, does not have a regular exercise routine.  Enjoys some usual interests and activities. Single. Lives with parents and 2 brothers. Spending time with family. Appetite adequate. Weight  stable. Sleeps well most nights. Averages 6 to 8 hours. Focus and concentration stable. Completing tasks. Managing aspects of household. Completed associates degree - [employer] and plans to attend a 4 year university. Working.  Denies SI or HI.  Denies AH or VH.    Previous medication trials: Prozac, Trintellix, Lexapro,    Review of Systems:  Review of Systems  Musculoskeletal:  Negative for gait problem.  Neurological:  Negative for tremors.  Psychiatric/Behavioral:         Please refer to HPI    Medications: I have reviewed the patient's current medications.  Current Outpatient Medications  Medication Sig Dispense Refill   amphetamine-dextroamphetamine (ADDERALL) 20 MG tablet Take 1 tablet (20 mg total) by mouth daily. 90 tablet 0   naproxen (NAPROSYN) 500 MG tablet Take 1 tablet (500 mg total) by mouth 2 (two) times daily with a meal. (Patient not taking: Reported on 02/14/2021) 30 tablet 0   tiZANidine (ZANAFLEX) 4 MG tablet Take 1 tablet (4 mg total) by mouth every 8 (eight) hours as needed. (Patient not taking: Reported on 02/14/2021) 30 tablet 0   No current facility-administered medications for this visit.    Medication Side Effects: None  Allergies:  Allergies  Allergen Reactions   Other     apples    Past Medical History:  Diagnosis Date   ADD (attention deficit disorder) 05/2018   Anxiety     Family History  Problem Relation Age of Onset   BRCA 1/2 Mother    Anxiety disorder Mother    Healthy Father     Social History   Socioeconomic History   Marital status: Single  Spouse name: Not on file   Number of children: Not on file   Years of education: Not on file   Highest education level: Not on file  Occupational History   Not on file  Tobacco Use   Smoking status: Every Day    Types: E-cigarettes   Smokeless tobacco: Never  Vaping Use   Vaping Use: Every day   Substances: Nicotine, Flavoring  Substance and Sexual Activity   Alcohol use: Never    Drug use: Never   Sexual activity: Yes    Birth control/protection: Condom  Other Topics Concern   Not on file  Social History Narrative   Biomedical engineer   Would like to go to [employer] for [employer]   Boyfriend   Social Determinants of Radio broadcast assistant Strain: Not on file  Food Insecurity: Not on file  Transportation Needs: Not on file  Physical Activity: Not on file  Stress: Not on file  Social Connections: Not on file  Intimate Partner Violence: Not on file    Past Medical History, Surgical history, Social history, and Family history were reviewed and updated as appropriate.   Please see review of systems for further details on the patient's review from today.   Objective:   Physical Exam:  There were no vitals taken for this visit.  Physical Exam Constitutional:      General: She is not in acute distress. Musculoskeletal:        General: No deformity.  Neurological:     Mental Status: She is alert and oriented to person, place, and time.     Coordination: Coordination normal.  Psychiatric:        Attention and Perception: Attention and perception normal. She does not perceive auditory or visual hallucinations.        Mood and Affect: Mood normal. Mood is not anxious or depressed. Affect is not labile, blunt, angry or inappropriate.        Speech: Speech normal.        Behavior: Behavior normal.        Thought Content: Thought content normal. Thought content is not paranoid or delusional. Thought content does not include homicidal or suicidal ideation. Thought content does not include homicidal or suicidal plan.        Cognition and Memory: Cognition and memory normal.        Judgment: Judgment normal.     Comments: Insight intact     Lab Review:     Component Value Date/Time   NA 138 06/15/2020 1201   K 3.8 06/15/2020 1201   CL 103 06/15/2020 1201   CO2 25 06/15/2020 1201   GLUCOSE 88 06/15/2020 1201   BUN 13  06/15/2020 1201   CREATININE 0.72 06/15/2020 1201   CALCIUM 9.7 06/15/2020 1201   PROT 7.3 06/15/2020 1201   ALBUMIN 4.7 06/11/2018 1024   AST 17 06/15/2020 1201   ALT 11 06/15/2020 1201   ALKPHOS 50 06/11/2018 1024   BILITOT 0.9 06/15/2020 1201   GFRNONAA 124 09/04/2018 0527   GFRAA 144 09/04/2018 0527       Component Value Date/Time   WBC 6.9 06/15/2020 1201   RBC 4.60 06/15/2020 1201   HGB 14.0 06/15/2020 1201   HCT 41.7 06/15/2020 1201   PLT 279 06/15/2020 1201   MCV 90.7 06/15/2020 1201   MCH 30.4 06/15/2020 1201   MCHC 33.6 06/15/2020 1201   RDW 12.8 06/15/2020 1201   LYMPHSABS 2,001 06/15/2020  1201   EOSABS 221 06/15/2020 1201   BASOSABS 48 06/15/2020 1201    No results found for: "POCLITH", "LITHIUM"   No results found for: "PHENYTOIN", "PHENOBARB", "VALPROATE", "CBMZ"   .res Assessment: Plan:    Plan:  PDMP reviewed  Continue 20mg  Adderall mg daily  Add Zoloft 50mg  daily   RTC 4 weeks months  Patient advised to contact office with any questions, adverse effects, or acute worsening in signs and symptoms.   Discussed potential benefits, risks, and side effects of stimulants with patient to include increased heart rate, palpitations, insomnia, increased anxiety, increased irritability, or decreased appetite.  Instructed patient to contact office if experiencing any significant tolerability issues.   There are no diagnoses linked to this encounter.   Please see After Visit Summary for patient specific instructions.  Future Appointments  Date Time Provider [place]  09/26/2022 10:20 AM Chikita Dogan, Berdie Ogren, NP CP-CP None    No orders of the defined types were placed in this encounter.     -------------------------------

## 2022-10-06 ENCOUNTER — Other Ambulatory Visit (HOSPITAL_COMMUNITY): Payer: Self-pay

## 2022-10-07 ENCOUNTER — Other Ambulatory Visit (HOSPITAL_COMMUNITY): Payer: Self-pay

## 2022-10-11 ENCOUNTER — Other Ambulatory Visit (HOSPITAL_COMMUNITY): Payer: Self-pay

## 2022-12-02 ENCOUNTER — Other Ambulatory Visit (HOSPITAL_COMMUNITY): Payer: Self-pay

## 2022-12-02 MED ORDER — AMOXICILLIN 500 MG PO CAPS
500.0000 mg | ORAL_CAPSULE | Freq: Three times a day (TID) | ORAL | 0 refills | Status: DC
  Filled 2022-12-02: qty 15, 5d supply, fill #0

## 2022-12-02 MED ORDER — HYDROCODONE-ACETAMINOPHEN 5-325 MG PO TABS
1.0000 | ORAL_TABLET | Freq: Four times a day (QID) | ORAL | 0 refills | Status: DC | PRN
  Filled 2022-12-02: qty 12, 3d supply, fill #0

## 2022-12-29 ENCOUNTER — Other Ambulatory Visit (HOSPITAL_COMMUNITY): Payer: Self-pay

## 2022-12-30 ENCOUNTER — Other Ambulatory Visit: Payer: Self-pay

## 2023-05-06 ENCOUNTER — Other Ambulatory Visit: Payer: Self-pay | Admitting: Adult Health

## 2023-05-06 DIAGNOSIS — F909 Attention-deficit hyperactivity disorder, unspecified type: Secondary | ICD-10-CM

## 2023-05-06 NOTE — Telephone Encounter (Signed)
Please call to schedule an appt, past due 

## 2023-05-08 ENCOUNTER — Other Ambulatory Visit (HOSPITAL_COMMUNITY): Payer: Self-pay

## 2023-05-08 MED ORDER — AMPHETAMINE-DEXTROAMPHETAMINE 20 MG PO TABS
20.0000 mg | ORAL_TABLET | Freq: Every day | ORAL | 0 refills | Status: AC
Start: 2023-05-08 — End: ?
  Filled 2023-05-08: qty 90, 90d supply, fill #0

## 2023-08-26 ENCOUNTER — Other Ambulatory Visit: Payer: Self-pay

## 2023-08-27 ENCOUNTER — Encounter (HOSPITAL_COMMUNITY): Payer: Self-pay

## 2023-08-27 ENCOUNTER — Other Ambulatory Visit: Payer: Self-pay

## 2023-08-27 ENCOUNTER — Emergency Department (HOSPITAL_COMMUNITY)
Admission: EM | Admit: 2023-08-27 | Discharge: 2023-08-28 | Disposition: A | Discharge status: Home / Self Care | Payer: 59 | Attending: Emergency Medicine | Admitting: Emergency Medicine

## 2023-08-27 DIAGNOSIS — R1030 Lower abdominal pain, unspecified: Secondary | ICD-10-CM | POA: Diagnosis not present

## 2023-08-27 DIAGNOSIS — K625 Hemorrhage of anus and rectum: Secondary | ICD-10-CM | POA: Insufficient documentation

## 2023-08-27 DIAGNOSIS — K921 Melena: Secondary | ICD-10-CM

## 2023-08-27 DIAGNOSIS — D72829 Elevated white blood cell count, unspecified: Secondary | ICD-10-CM | POA: Diagnosis not present

## 2023-08-27 DIAGNOSIS — K6289 Other specified diseases of anus and rectum: Secondary | ICD-10-CM | POA: Diagnosis not present

## 2023-08-27 DIAGNOSIS — Q272 Other congenital malformations of renal artery: Secondary | ICD-10-CM | POA: Diagnosis not present

## 2023-08-27 LAB — COMPREHENSIVE METABOLIC PANEL
ALT: 16 U/L (ref 0–44)
AST: 21 U/L (ref 15–41)
Albumin: 3.7 g/dL (ref 3.5–5.0)
Alkaline Phosphatase: 34 U/L — ABNORMAL LOW (ref 38–126)
Anion gap: 3 — ABNORMAL LOW (ref 5–15)
BUN: 8 mg/dL (ref 6–20)
CO2: 25 mmol/L (ref 22–32)
Calcium: 8.7 mg/dL — ABNORMAL LOW (ref 8.9–10.3)
Chloride: 108 mmol/L (ref 98–111)
Creatinine, Ser: 0.78 mg/dL (ref 0.44–1.00)
GFR, Estimated: >60 mL/min (ref >60)
Glucose, Bld: 97 mg/dL (ref 70–99)
Potassium: 3.5 mmol/L (ref 3.5–5.1)
Sodium: 136 mmol/L (ref 135–145)
Total Bilirubin: 0.2 mg/dL (ref <1.2)
Total Protein: 6.4 g/dL — ABNORMAL LOW (ref 6.5–8.1)

## 2023-08-27 LAB — CBC WITH DIFFERENTIAL/PLATELET
Abs Immature Granulocytes: 0.04 10*3/uL (ref 0.00–0.07)
Basophils Absolute: 0.1 10*3/uL (ref 0.0–0.1)
Basophils Relative: 1 %
Eosinophils Absolute: 0.2 10*3/uL (ref 0.0–0.5)
Eosinophils Relative: 1 %
HCT: 39.9 % (ref 36.0–46.0)
Hemoglobin: 13.5 g/dL (ref 12.0–15.0)
Immature Granulocytes: 0 %
Lymphocytes Relative: 13 %
Lymphs Abs: 1.7 10*3/uL (ref 0.7–4.0)
MCH: 30.7 pg (ref 26.0–34.0)
MCHC: 33.8 g/dL (ref 30.0–36.0)
MCV: 90.7 fL (ref 80.0–100.0)
Monocytes Absolute: 1.1 10*3/uL — ABNORMAL HIGH (ref 0.1–1.0)
Monocytes Relative: 9 %
Neutro Abs: 9.7 10*3/uL — ABNORMAL HIGH (ref 1.7–7.7)
Neutrophils Relative %: 76 %
Platelets: 339 10*3/uL (ref 150–400)
RBC: 4.4 MIL/uL (ref 3.87–5.11)
RDW: 12.5 % (ref 11.5–15.5)
WBC: 12.7 10*3/uL — ABNORMAL HIGH (ref 4.0–10.5)
nRBC: 0 % (ref 0.0–0.2)

## 2023-08-27 LAB — PREGNANCY, URINE: Preg Test, Ur: NEGATIVE

## 2023-08-27 LAB — LIPASE, BLOOD: Lipase: 31 U/L (ref 11–51)

## 2023-08-27 NOTE — ED Triage Notes (Signed)
Pt came in via POV d/t approx 8 months she saw a very small amt of blood from her rectum & it went away. Since then it has became more frequent & in larger volume, she reports she now bleeds larger amounts about 5 times a day with her abd cramps usually happened only when it occurs but the abd pain is now consistent from 5/10-10/10. A/Ox4. Endorses that this does not happen with only stool, said the blood will come out on its own, sometimes with mucus, and  te blood may be dark/tan or bright in color. Endorses occasional lightheadedness & dizziness, does feel nauseated & clammy at times.

## 2023-08-27 NOTE — Progress Notes (Unsigned)
    Celso Amy, PA-C [address]  [address]  [place], [place] [address]  Main: [phone]  Fax: [phone]   Gastroenterology Consultation  Referring Provider:     Jarold Motto, [place] Primary Care Physician:  Jarold Motto, [place] Primary Gastroenterologist:  *** Reason for Consultation:     Rectal bleeding        HPI:   Daliya L. Eib is a 22 y.o. y/o female referred for consultation & management  by Jarold Motto, PA.    She went to [facility] ED 08/27/2023.  History of ADHD and anxiety.  No previous GI evaluation.  Past Medical History:  Diagnosis Date   ADD (attention deficit disorder) 05/2018   Anxiety     No past surgical history on file.  Prior to Admission medications   Medication Sig Start Date End Date Taking? Authorizing Provider  amphetamine-dextroamphetamine (ADDERALL) 20 MG tablet Take 1 tablet (20 mg total) by mouth daily. 05/08/23   Mozingo, Thereasa Solo, NP    Family History  Problem Relation Age of Onset   BRCA 1/2 Mother    Anxiety disorder Mother    Healthy Father      Social History   Tobacco Use   Smoking status: Every Day    Types: E-cigarettes   Smokeless tobacco: Never  Vaping Use   Vaping status: Every Day   Substances: Nicotine, Flavoring  Substance Use Topics   Alcohol use: Never   Drug use: Never    Allergies as of 08/28/2023 - Review Complete 09/26/2022  Allergen Reaction Noted   Other  02/23/2018    Review of Systems:    All systems reviewed and negative except where noted in HPI.   Physical Exam:  There were no vitals taken for this visit. No LMP recorded.  General:   Alert,  Well-developed, well-nourished, pleasant and cooperative in NAD Lungs:  Respirations even and unlabored.  Clear throughout to auscultation.   No wheezes, crackles, or rhonchi. No acute distress. Heart:  Regular rate and rhythm; no murmurs, clicks, rubs, or gallops. Abdomen:  Normal bowel sounds.  No bruits.  Soft, and non-distended  without masses, hepatosplenomegaly or hernias noted.  No Tenderness.  No guarding or rebound tenderness.    Neurologic:  Alert and oriented x3;  grossly normal neurologically. Psych:  Alert and cooperative. Normal mood and affect.  Imaging Studies: No results found.  Assessment and Plan:   Mehlani L. Mosbey is a 22 y.o. y/o female has been referred for ***  Follow up ***  Celso Amy, PA-C    BP check ***

## 2023-08-27 NOTE — ED Provider Triage Note (Signed)
Emergency Medicine Provider Triage Evaluation Note  Diana Perry , a 22 y.o. female  was evaluated in triage.  Pt complains of rectal bleeding that has been intermittently occurring for the last 8 months.  Has been worse over the last couple of weeks.  She states she is having 5 grossly bloody bowel movements daily.  She is having lower abdominal pain.  She denies fever, chills, family history of inflammatory bowel disease.  She does state that symptoms are worse after alcohol consumption.  Review of Systems  Positive:  Negative: See above   Physical Exam  BP 115/80 (BP Location: Right Arm)   Pulse 75   Temp 99.1 F (37.3 C)   Resp 16   Ht 5\' 6"  (1.676 m)   Wt 59.9 kg   SpO2 100%   BMI 21.31 kg/m  Gen:   Awake, no distress   Resp:  Normal effort  MSK:   Moves extremities without difficulty  Other:  No abdominal tenderness  Medical Decision Making  Medically screening exam initiated at 4:53 PM.  Appropriate orders placed.  Diana Perry was informed that the remainder of the evaluation will be completed by another provider, this initial triage assessment does not replace that evaluation, and the importance of remaining in the ED until their evaluation is complete.     Honor Loh [place], [place] 08/27/23 [phone]

## 2023-08-28 ENCOUNTER — Emergency Department (HOSPITAL_COMMUNITY): Payer: 59

## 2023-08-28 ENCOUNTER — Ambulatory Visit: Payer: 59 | Admitting: Physician Assistant

## 2023-08-28 ENCOUNTER — Telehealth: Payer: Self-pay

## 2023-08-28 DIAGNOSIS — K6289 Other specified diseases of anus and rectum: Secondary | ICD-10-CM | POA: Diagnosis not present

## 2023-08-28 DIAGNOSIS — Q272 Other congenital malformations of renal artery: Secondary | ICD-10-CM | POA: Diagnosis not present

## 2023-08-28 LAB — C-REACTIVE PROTEIN: CRP: 0.5 mg/dL (ref <1.0)

## 2023-08-28 LAB — POC OCCULT BLOOD, ED: Fecal Occult Bld: POSITIVE — AB

## 2023-08-28 MED ORDER — ONDANSETRON HCL 4 MG/2ML IJ SOLN
4.0000 mg | Freq: Once | INTRAMUSCULAR | Status: DC
  Filled 2023-08-28: qty 2

## 2023-08-28 MED ORDER — MORPHINE SULFATE (PF) 4 MG/ML IV SOLN
4.0000 mg | Freq: Once | INTRAVENOUS | Status: DC
  Filled 2023-08-28: qty 1

## 2023-08-28 MED ORDER — IOHEXOL 350 MG/ML SOLN
75.0000 mL | Freq: Once | INTRAVENOUS | Status: AC | PRN
  Administered 2023-08-28: 75 mL via INTRAVENOUS

## 2023-08-28 NOTE — Telephone Encounter (Signed)
Alexandre Lightsey, pls call patient and schedule for an urgent office appt next week with any available APP ( I am off work next week) or MD, patient will need outpatient colonoscopy. Patient presented to the ED today, no fissure or hemorrhoids on exam per ED provider, stable and will be discharged home. Pls let me know when patient is scheduled. I recommended Miralax Q HS and patient to return to the ED if she has significant hematochezia.

## 2023-08-28 NOTE — Telephone Encounter (Signed)
Patient called in to get a sooner appointment. As of right now 12/13 is the soonest we have. She was discharged earlier today, and when asked if she felt if bleeding has worsened she said yes and that bleeding has been consistent. Advised her to go back to ED, but d/t wait times she's hesitant to go. Educated her on the importance again & that I would make Jill Side, NP aware that she may be coming back. Secure chat sent to provider & Dr. Adela Lank is aware. He stated "Dejay Kronk her HGb is normal, I don't think she is having significant bleeding as least what she has had so far. If it is significantly worse she can come back today but may want to watch this and see how it goes tonight and can always come back over the weekend if needed " Called patient back and made her aware of his recommendations. She verbalized all understanding.

## 2023-08-28 NOTE — Telephone Encounter (Signed)
Glendora Score, pls have [name] call patient on Monday 08/31/2023 for symptom update, provide Gunnar Fusi with update. THX.

## 2023-08-28 NOTE — Discharge Instructions (Addendum)
As discussed, your labs and imaging are reassuring.   GI will call you shortly to schedule an appointment.  They advised taking MiraLAX if you feel like you have to strain with bowel movements, which can help with the bleeding.   If you have increased bleeding from per rectum return to the ED immediately.

## 2023-08-28 NOTE — ED Provider Notes (Signed)
[place] EMERGENCY DEPARTMENT AT [facility] Provider Note   CSN: [id number] Arrival date & time: 08/27/23  1548     History  Chief Complaint  Patient presents with   GI Problem    Diana Perry is a 22 y.o. female.  22 year old female presents today for concern of rectal bleeding.  She stated has been worse for the past 1 week where she has been having largely bloody bowel movements 5 times a day that cover the entire toilet bowl.  Denies rectal trauma.  No history of hemorrhoids.  Denies any other medical history she is aware of.  States she does also have constant abdominal pain that minimally improves after bowel movement.  Localized to her lower abdomen.  No GYN symptoms.  She states she also had some bleeding 8 months ago but it was minor compared to this.  Denies vomiting.  Denies  melanotic stools.  The history is provided by the patient. No language interpreter was used.       Home Medications Prior to Admission medications   Medication Sig Start Date End Date Taking? Authorizing Provider  amphetamine-dextroamphetamine (ADDERALL) 20 MG tablet Take 1 tablet (20 mg total) by mouth daily. 05/08/23   Mozingo, Thereasa Solo, NP      Allergies    Other    Review of Systems   Review of Systems  Constitutional:  Negative for chills and fever.  Respiratory:  Negative for shortness of breath.   Cardiovascular:  Negative for chest pain.  Gastrointestinal:  Positive for abdominal pain and blood in stool. Negative for nausea and vomiting.  Neurological:  Negative for light-headedness.  All other systems reviewed and are negative.   Physical Exam Updated Vital Signs BP 109/75   Pulse 76   Temp 98.2 F (36.8 C)   Resp 16   Ht 5\' 6"  (1.676 m)   Wt 59.9 kg   SpO2 100%   BMI 21.31 kg/m  Physical Exam Vitals and nursing note reviewed.  Constitutional:      General: She is not in acute distress.    Appearance: Normal appearance. She is not ill-appearing.   HENT:     Head: Normocephalic and atraumatic.     Nose: Nose normal.  Eyes:     General: No scleral icterus.    Extraocular Movements: Extraocular movements intact.     Conjunctiva/sclera: Conjunctivae normal.  Cardiovascular:     Rate and Rhythm: Normal rate and regular rhythm.     Heart sounds: Normal heart sounds.  Pulmonary:     Effort: Pulmonary effort is normal. No respiratory distress.     Breath sounds: Normal breath sounds. No wheezing or rales.  Abdominal:     General: There is no distension.     Palpations: Abdomen is soft.     Tenderness: There is abdominal tenderness. There is no guarding.  Genitourinary:    Comments: Nurse present as chaperone.  No external hemorrhoids noted.  No anal fissure.  No concerning findings.  No stool sample appreciated. Musculoskeletal:        General: Normal range of motion.     Cervical back: Normal range of motion.  Skin:    General: Skin is warm and dry.  Neurological:     General: No focal deficit present.     Mental Status: She is alert. Mental status is at baseline.     ED Results / Procedures / Treatments   Labs (all labs ordered are listed, but only  abnormal results are displayed) Labs Reviewed  CBC WITH DIFFERENTIAL/PLATELET - Abnormal; Notable for the following components:      Result Value   WBC 12.7 (*)    Neutro Abs 9.7 (*)    Monocytes Absolute 1.1 (*)    All other components within normal limits  COMPREHENSIVE METABOLIC PANEL - Abnormal; Notable for the following components:   Calcium 8.7 (*)    Total Protein 6.4 (*)    Alkaline Phosphatase 34 (*)    Anion gap 3 (*)    All other components within normal limits  POC OCCULT BLOOD, ED - Abnormal; Notable for the following components:   Fecal Occult Bld POSITIVE (*)    All other components within normal limits  LIPASE, BLOOD  PREGNANCY, URINE    EKG None  Radiology No results found.  Procedures Procedures    Medications Ordered in ED Medications   morphine (PF) 4 MG/ML injection 4 mg (0 mg Intravenous Hold 08/28/23 0536)  ondansetron (ZOFRAN) injection 4 mg (0 mg Intravenous Hold 08/28/23 0536)    ED Course/ Medical Decision Making/ A&P                                 Medical Decision Making Amount and/or Complexity of Data Reviewed Labs: ordered. Radiology: ordered.  Risk Prescription drug management.   22 year old female presents today for concern of abdominal pain and rectal bleeding.  Has had up to 5 episodes per day for the past week of rectal bleeding that cover the toilet bowl.  No prior history of this significant bleeding before.  Denies any history of inflammatory bowel disease.  No nausea or vomiting.  CBC shows mild leukocytosis, no anemia.  Hemoglobin 13.5.  Pregnancy test negative.  CMP without acute concern.  Lipase within normal.  Hemoccult positive.  Will order CT angio GI bleed study.  Discussed with [facility] gastroenterology on-call provider.  Recommend CT, adding on an ESR, and obtaining a stool sample if possible.  He would like to be called back regardless of the findings to expedite outpatient follow-up if all the findings are reassuring.  Concern for inflammatory bowel disease.  CT shows mucosal thickening and hyperemia of the rectum.  CRP pending.  Signed out to oncoming provider to discuss with GI for final plan.   Final Clinical Impression(s) / ED Diagnoses Final diagnoses:  None    Rx / DC Orders ED Discharge Orders     None         Marita Kansas, PA-C 08/28/23 8119    Glynn Octave, MD 08/28/23 [phone]

## 2023-08-28 NOTE — Telephone Encounter (Signed)
Per ED note, patient is aware of follow up & recommendations prior to appointment. [name], RN will call on Monday to get an update on patient.

## 2023-08-28 NOTE — Telephone Encounter (Signed)
Inbound call from patient requesting a call to discuss if there is a sooner appointment. States she is not doing well and believes 12/13 is too far. Please advise, thank you.

## 2023-08-28 NOTE — Telephone Encounter (Signed)
Pt has been scheduled for 09/04/23 at 9:30 am with Gunnar Fusi, NP.

## 2023-08-28 NOTE — ED Provider Notes (Signed)
  Physical Exam  BP 109/75   Pulse 76   Temp 98.2 F (36.8 C)   Resp 16   Ht 5\' 6"  (1.676 m)   Wt 59.9 kg   SpO2 100%   BMI 21.31 kg/m   Physical Exam  Procedures  Procedures  ED Course / MDM    Medical Decision Making Amount and/or Complexity of Data Reviewed Labs: ordered. Radiology: ordered.  Risk Prescription drug management.   Patient care taken over at shift change. Disposition pending CRP results, then GI needs to be re-consulted.  CTA of the abdomen and pelvis showed no evidence of acute GI bleed.  Suspect mucosal thickening in hyperemia in the rectum transitioning at the rectum sigmoid junction.  CRP is normal at 0.5. Patient states that she does not need to use the bathroom and can't give [place] a stool sample.  GI re-consulted.  I spoke with Junious Dresser, an APP from [facility].  She ran case by her attending as well and they recommend urgent outpatient follow-up if patient is stable otherwise they can consult if she is admitted to the hospital.  I spoke with patient and she states that she would like to be discharged.   Additionally, Junious Dresser stated that patient can try MiraLAX if she has straining with bowel movements.  Patient instructed to do so.  She has been instructed to return to the ED immediately if she has increased bleeding.  Patient is understandable and agreeable with this plan.  She is hemodynamically stable with a normal H&H.   Maxwell Marion, PA-C 08/28/23 0940    Gerhard Munch, MD 08/28/23 [phone]

## 2023-08-30 NOTE — Telephone Encounter (Signed)
Diana Perry, looks like patient scheduled GI appt with Shelly Rubenstein PA-C on 12/9.

## 2023-08-31 ENCOUNTER — Encounter: Payer: Self-pay | Admitting: Physician Assistant

## 2023-08-31 ENCOUNTER — Other Ambulatory Visit (HOSPITAL_COMMUNITY): Payer: Self-pay

## 2023-08-31 ENCOUNTER — Ambulatory Visit: Payer: Self-pay | Admitting: Physician Assistant

## 2023-08-31 VITALS — BP 104/63 | HR 85 | Temp 97.7°F | Ht 65.0 in | Wt 132.4 lb

## 2023-08-31 DIAGNOSIS — K625 Hemorrhage of anus and rectum: Secondary | ICD-10-CM | POA: Diagnosis not present

## 2023-08-31 MED ORDER — NA SULFATE-K SULFATE-MG SULF 17.5-3.13-1.6 GM/177ML PO SOLN
1.0000 | Freq: Once | ORAL | 0 refills | Status: AC
Start: 2023-08-31 — End: 2023-09-02
  Filled 2023-08-31: qty 354, 1d supply, fill #0

## 2023-08-31 MED ORDER — HYDROCORTISONE ACETATE 25 MG RE SUPP
25.0000 mg | Freq: Every day | RECTAL | 1 refills | Status: DC
  Filled 2023-08-31: qty 24, 24d supply, fill #0

## 2023-08-31 NOTE — Progress Notes (Signed)
Celso Amy, PA-C [address]  [address]  [place], [place] [address]  Main: [phone]  Fax: [phone]   Gastroenterology Consultation  Referring Provider:     Jarold Motto, [place] Primary Care Physician:  Jarold Motto, [place] Primary Gastroenterologist:  Celso Amy, PA-C / Dr. Wyline Mood   Reason for Consultation:     Rectal bleeding        HPI:   Diana Perry is a 22 y.o. y/o female referred for consultation & management  by Jarold Motto, PA.    Patient went to [facility] ED 08/27/2023 to evaluate rectal bleeding.  She has had intermittent episodes of rectal bleeding for approximately 6 months.  Rectal bleeding has been worse in the past 2 weeks.  She has been passing blood and mucus.  Having tenesmus and not passing much stool.  She denies constipation, diarrhea, or rectal pain.  She has mild generalized lower abdominal cramping.  Currently on liquid diet.  She has been seeing a lot of bright red blood in the toilet.  Having up to 5 bloody bowel movements per day.  She is avoiding alcohol and caffeine.  No previous GI evaluation or colonoscopy.  No known family history of IBD.  08/27/2023 labs: Normal CRP, negative pregnancy test, FOBT positive, WBC 12.7, hemoglobin 13.5.  08/27/2023 CTA abdomen and pelvis with and without contrast: Mucosal thickening and hyperemia in the rectum transitioning at the rectosigmoid junction.  Remainder of the colon was normal.  Past Medical History:  Diagnosis Date   ADD (attention deficit disorder) 05/2018   Anxiety     History reviewed. No pertinent surgical history.  Prior to Admission medications   Medication Sig Start Date End Date Taking? Authorizing Provider  amphetamine-dextroamphetamine (ADDERALL) 20 MG tablet Take 1 tablet (20 mg total) by mouth daily. Patient taking differently: Take 10 mg by mouth daily. 05/08/23   Mozingo, Thereasa Solo, NP    Family History  Problem Relation Age of Onset   BRCA 1/2 Mother     Anxiety disorder Mother    Healthy Father      Social History   Tobacco Use   Smoking status: Every Day    Types: E-cigarettes   Smokeless tobacco: Never  Vaping Use   Vaping status: Every Day   Substances: Nicotine, Flavoring  Substance Use Topics   Alcohol use: Never   Drug use: Never    Allergies as of 08/31/2023 - Review Complete 08/31/2023  Allergen Reaction Noted   Other  02/23/2018    Review of Systems:    All systems reviewed and negative except where noted in HPI.   Physical Exam:  BP 104/63   Pulse 85   Temp 97.7 F (36.5 C)   Ht 5\' 5"  (1.651 m)   Wt 132 lb 6.4 oz (60.1 kg)   BMI 22.03 kg/m  No LMP recorded.  General:   Alert,  Well-developed, well-nourished, pleasant and cooperative in NAD Lungs:  Respirations even and unlabored.  Clear throughout to auscultation.   No wheezes, crackles, or rhonchi. No acute distress. Heart:  Regular rate and rhythm; no murmurs, clicks, rubs, or gallops. Abdomen:  Normal bowel sounds.  No bruits.  Soft, and non-distended without masses, hepatosplenomegaly or hernias noted.  Mild bilateral lower tenderness, LLQ greater than RLQ.  No upper abdominal tenderness.  No inflammatory masses.  No guarding or rebound tenderness.    Neurologic:  Alert and oriented x3;  grossly normal neurologically. Psych:  Alert and cooperative.  Normal mood and affect.  Imaging Studies: CT ANGIO GI BLEED  Result Date: 08/28/2023 CLINICAL DATA:  Lower GI bleed (Ped 0-17y) Pt complains of rectal bleeding that has been intermittently occurring for the last 8 months. Has been worse over the last couple of weeks. She states she is having 5 grossly bloody bowel movements daily EXAM: CTA ABDOMEN AND PELVIS WITHOUT AND WITH CONTRAST TECHNIQUE: Multidetector CT imaging of the abdomen and pelvis was performed using the standard protocol during bolus administration of intravenous contrast. Multiplanar reconstructed images and MIPs were obtained and reviewed to  evaluate the vascular anatomy. RADIATION DOSE REDUCTION: This exam was performed according to the departmental dose-optimization program which includes automated exposure control, adjustment of the mA and/or kV according to patient size and/or use of iterative reconstruction technique. CONTRAST:  75mL OMNIPAQUE IOHEXOL 350 MG/ML SOLN COMPARISON:  Pelvic ultrasound, 06/15/2020. FINDINGS: VASCULAR Aorta: Normal caliber aorta without aneurysm, dissection, vasculitis or significant stenosis. Celiac: Patent without evidence of aneurysm, dissection, vasculitis or significant stenosis. SMA: Patent without evidence of aneurysm, dissection, vasculitis or significant stenosis. Renals: A single LEFT and 2 (main and accessory) RIGHT renal arteries are present. All renal arteries are patent without evidence of aneurysm, dissection, vasculitis, fibromuscular dysplasia or significant stenosis. IMA: Patent without evidence of aneurysm, dissection, vasculitis or significant stenosis. Pelvis: Patent without evidence of aneurysm, dissection, vasculitis or significant stenosis. Proximal Outflow: Bilateral common femoral and visualized portions of the superficial and profunda femoral arteries are patent without evidence of aneurysm, dissection, vasculitis or significant stenosis. Veins: No obvious venous abnormality within the limitations of this arterial phase study. Review of the MIP images confirms the above findings. NON-VASCULAR Lower chest: No acute abnormality. Hepatobiliary: No focal liver abnormality. No gallstones, gallbladder wall thickening, or biliary dilatation. Pancreas: No pancreatic ductal dilatation or surrounding inflammatory changes. Spleen: Normal in size without focal abnormality. Adrenals/Urinary Tract: Adrenal glands are unremarkable. Kidneys are normal, without renal calculi, focal lesion, or hydronephrosis. Bladder is unremarkable. Stomach/Bowel: Stomach is within normal limits. Appendix is not definitively  visualized. Nonobstructed small bowel. Nondilated colon. Relative mucosal thinning with hyperemia involving the rectum, with transition at the rectosigmoid junction. No evidence of bowel wall thickening, distention, or inflammatory changes. Lymphatic: No enlarged abdominal or pelvic lymph nodes. Reproductive: Uterus and adnexa are unremarkable. Other: No abdominal wall hernia or abnormality. No abdominopelvic ascites. Musculoskeletal: No acute or significant osseous findings. IMPRESSION: VASCULAR No acute vascular abnormality within the abdomen or pelvis. Specifically, no CTA evidence of acute gastrointestinal bleeding. NON-VASCULAR Suspect mucosal thinning and hyperemia at the rectum, transitioning at the rectosigmoid junction. (See key images) Consider correlation with direct visualization on sigmoidoscopy Roanna Banning, MD [facility] Radiology Electronically Signed   By: Roanna Banning M.D.   On: 08/28/2023 06:23    Assessment and Plan:   Diana Perry is a 22 y.o. y/o female has been referred for   1.  Rectal bleeding for 6 months, worsening for 2 weeks.  I am suspicious for ulcerative proctitis.  Lab: CBC, iron, ferritin  Scheduling Colonoscopy I discussed risks of colonoscopy with patient to include risk of bleeding, colon perforation, and risk of sedation.  Patient expressed understanding and agrees to proceed with colonoscopy.   Rx: Hydrocortisone suppository, 25 Mg 1 into the rectum at bedtime for 12 days.  Follow up 2 - 4 weeks after colonoscopy with TG.  Celso Amy, PA-C

## 2023-09-01 ENCOUNTER — Other Ambulatory Visit (HOSPITAL_COMMUNITY): Payer: Self-pay

## 2023-09-01 LAB — CBC WITH DIFFERENTIAL/PLATELET
Basophils Absolute: 0.1 10*3/uL (ref 0.0–0.2)
Basos: 1 %
EOS (ABSOLUTE): 0.2 10*3/uL (ref 0.0–0.4)
Eos: 2 %
Hematocrit: 40.1 % (ref 34.0–46.6)
Hemoglobin: 13.2 g/dL (ref 11.1–15.9)
Immature Grans (Abs): 0 10*3/uL (ref 0.0–0.1)
Immature Granulocytes: 0 %
Lymphocytes Absolute: 1.7 10*3/uL (ref 0.7–3.1)
Lymphs: 17 %
MCH: 30.1 pg (ref 26.6–33.0)
MCHC: 32.9 g/dL (ref 31.5–35.7)
MCV: 92 fL (ref 79–97)
Monocytes Absolute: 1.3 10*3/uL — ABNORMAL HIGH (ref 0.1–0.9)
Monocytes: 13 %
Neutrophils Absolute: 7 10*3/uL (ref 1.4–7.0)
Neutrophils: 67 %
Platelets: 338 10*3/uL (ref 150–450)
RBC: 4.38 x10E6/uL (ref 3.77–5.28)
RDW: 11.8 % (ref 11.7–15.4)
WBC: 10.3 10*3/uL (ref 3.4–10.8)

## 2023-09-01 LAB — IRON,TIBC AND FERRITIN PANEL
Ferritin: 28 ng/mL (ref 15–150)
Iron Saturation: 11 % — ABNORMAL LOW (ref 15–55)
Iron: 29 ug/dL (ref 27–159)
Total Iron Binding Capacity: 265 ug/dL (ref 250–450)
UIBC: 236 ug/dL (ref 131–425)

## 2023-09-01 NOTE — Telephone Encounter (Signed)
Pt was contacted. Pt stated that she has seen another provider through [facility] in [place]. Pt stated that she is scheduled for a Colonoscopy tomorrow. Pt appointment to see Willette Cluster NP on 09/04/2023 was canceled. Pt made aware.  Pt verbalized understanding with all questions answered.

## 2023-09-01 NOTE — Telephone Encounter (Signed)
Left message for pt to call back  °

## 2023-09-02 ENCOUNTER — Other Ambulatory Visit (HOSPITAL_COMMUNITY): Payer: Self-pay

## 2023-09-02 ENCOUNTER — Other Ambulatory Visit: Payer: Self-pay | Admitting: Gastroenterology

## 2023-09-02 ENCOUNTER — Encounter: Payer: Self-pay | Admitting: Gastroenterology

## 2023-09-02 ENCOUNTER — Ambulatory Visit: Payer: 59 | Admitting: Anesthesiology

## 2023-09-02 ENCOUNTER — Ambulatory Visit
Admission: RE | Admit: 2023-09-02 | Discharge: 2023-09-02 | Disposition: A | Discharge status: Home / Self Care | Payer: 59 | Attending: Gastroenterology | Admitting: Gastroenterology

## 2023-09-02 ENCOUNTER — Encounter
Admission: RE | Disposition: A | Discharge status: Home / Self Care | Payer: Self-pay | Source: Home / Self Care | Attending: Gastroenterology

## 2023-09-02 DIAGNOSIS — K625 Hemorrhage of anus and rectum: Secondary | ICD-10-CM

## 2023-09-02 DIAGNOSIS — K6289 Other specified diseases of anus and rectum: Secondary | ICD-10-CM | POA: Diagnosis not present

## 2023-09-02 DIAGNOSIS — K529 Noninfective gastroenteritis and colitis, unspecified: Secondary | ICD-10-CM

## 2023-09-02 DIAGNOSIS — F17290 Nicotine dependence, other tobacco product, uncomplicated: Secondary | ICD-10-CM | POA: Diagnosis not present

## 2023-09-02 DIAGNOSIS — K5100 Ulcerative (chronic) pancolitis without complications: Secondary | ICD-10-CM

## 2023-09-02 DIAGNOSIS — F988 Other specified behavioral and emotional disorders with onset usually occurring in childhood and adolescence: Secondary | ICD-10-CM | POA: Insufficient documentation

## 2023-09-02 HISTORY — PX: BIOPSY: SHX5522

## 2023-09-02 HISTORY — PX: COLONOSCOPY WITH PROPOFOL: SHX5780

## 2023-09-02 LAB — POCT PREGNANCY, URINE: Preg Test, Ur: NEGATIVE

## 2023-09-02 SURGERY — COLONOSCOPY WITH PROPOFOL
Anesthesia: General

## 2023-09-02 MED ORDER — MIDAZOLAM HCL 2 MG/2ML IJ SOLN
INTRAMUSCULAR | Status: AC
  Filled 2023-09-02: qty 2

## 2023-09-02 MED ORDER — MIDAZOLAM HCL 2 MG/2ML IJ SOLN
INTRAMUSCULAR | Status: DC | PRN
  Administered 2023-09-02: 2 mg via INTRAVENOUS

## 2023-09-02 MED ORDER — PROPOFOL 10 MG/ML IV BOLUS
INTRAVENOUS | Status: DC | PRN
  Administered 2023-09-02: 20 mg via INTRAVENOUS
  Administered 2023-09-02: 60 mg via INTRAVENOUS

## 2023-09-02 MED ORDER — PROPOFOL 500 MG/50ML IV EMUL
INTRAVENOUS | Status: DC | PRN
  Administered 2023-09-02: 165 ug/kg/min via INTRAVENOUS

## 2023-09-02 MED ORDER — LIDOCAINE HCL (CARDIAC) PF 100 MG/5ML IV SOSY
PREFILLED_SYRINGE | INTRAVENOUS | Status: DC | PRN
  Administered 2023-09-02: 60 mg via INTRAVENOUS

## 2023-09-02 MED ORDER — SODIUM CHLORIDE 0.9 % IV SOLN: INTRAVENOUS | Status: DC

## 2023-09-02 NOTE — Anesthesia Postprocedure Evaluation (Signed)
Anesthesia Post Note  Patient: Diana Perry  Procedure(s) Performed: COLONOSCOPY WITH PROPOFOL BIOPSY  Patient location during evaluation: Endoscopy Anesthesia Type: General Level of consciousness: awake and alert Pain management: pain level controlled Vital Signs Assessment: post-procedure vital signs reviewed and stable Respiratory status: spontaneous breathing, nonlabored ventilation, respiratory function stable and patient connected to nasal cannula oxygen Cardiovascular status: blood pressure returned to baseline and stable Postop Assessment: no apparent nausea or vomiting Anesthetic complications: no   No notable events documented.   Last Vitals:  Vitals:   09/02/23 1412 09/02/23 1422  BP:    Pulse:    Resp:    Temp:    SpO2: 100% 100%    Last Pain:  Vitals:   09/02/23 1422  TempSrc:   PainSc: 0-No pain                 Louie Boston

## 2023-09-02 NOTE — H&P (Signed)
Wyline Mood, MD [address], [place], [place], [address] [address], [place], [place], [address] Phone: [phone]  Fax: [phone]  Primary Care Physician:  Jarold Motto, [place]   Pre-Procedure History & Physical: HPI:  Diana Perry is a 22 y.o. female is here for an colonoscopy.   Past Medical History:  Diagnosis Date   ADD (attention deficit disorder) 05/2018   Anxiety     Past Surgical History:  Procedure Laterality Date   WISDOM TOOTH EXTRACTION      Prior to Admission medications   Medication Sig Start Date End Date Taking? Authorizing Provider  amphetamine-dextroamphetamine (ADDERALL) 20 MG tablet Take 1 tablet (20 mg total) by mouth daily. Patient taking differently: Take 10 mg by mouth daily. 05/08/23   Mozingo, Thereasa Solo, NP  hydrocortisone (ANUSOL-HC) 25 MG suppository Place 1 suppository (25 mg total) rectally at bedtime as directed 08/31/23 09/24/23  Celso Amy, PA-C  Na Sulfate-K Sulfate-Mg Sulf (SUPREP BOWEL PREP KIT) 17.5-3.13-1.6 GM/177ML SOLN Take 1 Box by mouth once for 1 dose. At 5 PM the day before your procedure pour the contents of one bottle of Suprep into the mixing container provided.  Fill the container, with ice cold water, up to the 16 oz fill line, and drink the entire amount. Then 5 hours before procedure pour the contents of the second bottle of Suprep into the mixing container provided and follow the same instructions. 08/31/23 09/02/23  Celso Amy, PA-C    Allergies as of 08/31/2023 - Review Complete 08/31/2023  Allergen Reaction Noted   Other  02/23/2018    Family History  Problem Relation Age of Onset   BRCA 1/2 Mother    Anxiety disorder Mother    Healthy Father     Social History   Socioeconomic History   Marital status: Single    Spouse name: Not on file   Number of children: Not on file   Years of education: Not on file   Highest education level: Not on file  Occupational History    Not on file  Tobacco Use   Smoking status: Every Day    Types: E-cigarettes   Smokeless tobacco: Never  Vaping Use   Vaping status: Every Day   Substances: Nicotine, Flavoring  Substance and Sexual Activity   Alcohol use: Never   Drug use: Never   Sexual activity: Yes    Birth control/protection: Condom  Other Topics Concern   Not on file  Social History Narrative   Scientific laboratory technician   Would like to go to [employer] for [employer]   Boyfriend   Social Determinants of Health   Financial Resource Strain: Not on file  Food Insecurity: Not on file  Transportation Needs: Not on file  Physical Activity: Not on file  Stress: Not on file  Social Connections: Unknown (02/06/2022)   Received from [facility]   Social Network    Social Network: Not on file  Intimate Partner Violence: Unknown (02/06/2022)   Received from [facility]   HITS    Physically Hurt: Not on file    Insult or Talk Down To: Not on file    Threaten Physical Harm: Not on file    Scream or Curse: Not on file    Review of Systems: See HPI, otherwise negative ROS  Physical Exam: BP 105/61   Pulse 88   Temp (!) 97.3 F (36.3 C) (Temporal)  Resp 16   Wt 58.9 kg   SpO2 100%   BMI 21.60 kg/m  General:   Alert,  pleasant and cooperative in NAD Head:  Normocephalic and atraumatic. Neck:  Supple; no masses or thyromegaly. Lungs:  Clear throughout to auscultation, normal respiratory effort.    Heart:  +S1, +S2, Regular rate and rhythm, No edema. Abdomen:  Soft, nontender and nondistended. Normal bowel sounds, without guarding, and without rebound.   Neurologic:  Alert and  oriented x4;  grossly normal neurologically.  Impression/Plan: Diana Perry is here for an colonoscopy to be performed for rectal bleeding  Risks, benefits, limitations, and alternatives regarding  colonoscopy have been reviewed with the patient.  Questions have been answered.  All  parties agreeable.   Wyline Mood, MD  09/02/2023, 12:58 PM

## 2023-09-02 NOTE — Transfer of Care (Signed)
Immediate Anesthesia Transfer of Care Note  Patient: Diana Perry  Procedure(s) Performed: COLONOSCOPY WITH PROPOFOL BIOPSY  Patient Location: Endoscopy Unit  Anesthesia Type:General  Level of Consciousness: drowsy and patient cooperative  Airway & Oxygen Therapy: Patient Spontanous Breathing and Patient connected to face mask oxygen  Post-op Assessment: Report given to RN and Post -op Vital signs reviewed and stable  Post vital signs: Reviewed and stable  Last Vitals:  Vitals Value Taken Time  BP 92/61 09/02/23 1402  Temp 36.1 C 09/02/23 1402  Pulse 84 09/02/23 1402  Resp 17 09/02/23 1402  SpO2 100 % 09/02/23 1402  Vitals shown include unfiled device data.  Last Pain:  Vitals:   09/02/23 1402  TempSrc: Temporal  PainSc: Asleep         Complications: No notable events documented.

## 2023-09-02 NOTE — Anesthesia Preprocedure Evaluation (Signed)
Anesthesia Evaluation  Patient identified by MRN, date of birth, ID band Patient awake    Reviewed: Allergy & Precautions, NPO status , Patient's Chart, lab work & pertinent test results  History of Anesthesia Complications Negative for: history of anesthetic complications  Airway Mallampati: I  TM Distance: >3 FB Neck ROM: full    Dental no notable dental hx.    Pulmonary neg pulmonary ROS, Current Smoker and Patient abstained from smoking.   Pulmonary exam normal        Cardiovascular negative cardio ROS Normal cardiovascular exam     Neuro/Psych  PSYCHIATRIC DISORDERS Anxiety     negative neurological ROS     GI/Hepatic negative GI ROS, Neg liver ROS,,,  Endo/Other  negative endocrine ROS    Renal/GU negative Renal ROS  negative genitourinary   Musculoskeletal   Abdominal   Peds  Hematology negative hematology ROS (+)   Anesthesia Other Findings Past Medical History: 05/2018: ADD (attention deficit disorder) No date: Anxiety  Past Surgical History: No date: WISDOM TOOTH EXTRACTION  BMI    Body Mass Index: 21.60 kg/m      Reproductive/Obstetrics negative OB ROS                             Anesthesia Physical Anesthesia Plan  ASA: 1  Anesthesia Plan: General   Post-op Pain Management: Minimal or no pain anticipated   Induction: Intravenous  PONV Risk Score and Plan: 2 and Propofol infusion and TIVA  Airway Management Planned: Natural Airway and Nasal Cannula  Additional Equipment:   Intra-op Plan:   Post-operative Plan:   Informed Consent: I have reviewed the patients History and Physical, chart, labs and discussed the procedure including the risks, benefits and alternatives for the proposed anesthesia with the patient or authorized representative who has indicated his/her understanding and acceptance.     Dental Advisory Given  Plan Discussed with:  Anesthesiologist, CRNA and Surgeon  Anesthesia Plan Comments: (Patient consented for risks of anesthesia including but not limited to:  - adverse reactions to medications - risk of airway placement if required - damage to eyes, teeth, lips or other oral mucosa - nerve damage due to positioning  - sore throat or hoarseness - Damage to heart, brain, nerves, lungs, other parts of body or loss of life  Patient voiced understanding and assent.)        Anesthesia Quick Evaluation

## 2023-09-02 NOTE — Addendum Note (Signed)
Addended by: Adela Ports on: 09/02/2023 02:36 PM   Modules accepted: Orders

## 2023-09-02 NOTE — Anesthesia Procedure Notes (Signed)
Procedure Name: General with mask airway Date/Time: 09/02/2023 1:52 PM  Performed by: Mohammed Kindle, CRNAPre-anesthesia Checklist: Patient identified, Emergency Drugs available, Suction available and Patient being monitored Patient Re-evaluated:Patient Re-evaluated prior to induction Oxygen Delivery Method: Simple face mask Induction Type: IV induction Placement Confirmation: positive ETCO2, CO2 detector and breath sounds checked- equal and bilateral Dental Injury: Teeth and Oropharynx as per pre-operative assessment

## 2023-09-02 NOTE — Op Note (Signed)
[facility] Patient Name: Diana Perry Procedure Date: 09/02/2023 1:37 PM MRN: [id number] Account #: [id number] Date of Birth: [birth year] Admit Type: Outpatient Age: 22 Room: [facility] ENDO ROOM 3 Gender: Female Note Status: Finalized Instrument Name: Peds Colonoscope [id number] Procedure:             Colonoscopy Indications:           Rectal bleeding Providers:             Wyline Mood MD, MD Referring MD:          Jarold Motto (Referring MD) Medicines:             Monitored Anesthesia Care Complications:         No immediate complications. Procedure:             Pre-Anesthesia Assessment:                        - Prior to the procedure, a History and Physical was                         performed, and patient medications, allergies and                         sensitivities were reviewed. The patient's tolerance                         of previous anesthesia was reviewed.                        - The risks and benefits of the procedure and the                         sedation options and risks were discussed with the                         patient. All questions were answered and informed                         consent was obtained.                        - ASA Grade Assessment: II - A patient with mild                         systemic disease.                        After obtaining informed consent, the colonoscope was                         passed under direct vision. Throughout the procedure,                         the patient's blood pressure, pulse, and oxygen                         saturations were monitored continuously. The                         Colonoscope was introduced through the anus  and                         advanced to the the transverse colon for evaluation.                         This was the intended extent. The colonoscopy was                         performed with ease. The patient tolerated the                          procedure well. The quality of the bowel preparation                         was fair. Findings:      The perianal and digital rectal examinations were normal.      Inflammation characterized by adherent blood, congestion (edema),       friability, granularity, loss of vascularity, mucus and confluent       ulcerations was found in a continuous and circumferential pattern from       the anus to the transverse colon. No sites were spared. The inflammation       was severe, and when compared to previous examinations, the findings are       new. Biopsies were taken with a cold forceps for histology. Impression:            - Preparation of the colon was fair.                        - Colitis. Inflammation was found from the anus to the                         transverse colon. This was severe, new compared to                         previous examinations. Biopsied. Recommendation:        - Discharge patient to home (with escort).                        - Resume previous diet.                        - Continue present medications.                        - Await pathology results.                        - Check stool for fecal calprotectin , gi pcr , c diff                         and i negative will start on steroids if bx show                         features of ulcerative colitis                        Office visit in 7-10 days Procedure Code(s):     ---  Professional ---                        [phone], 52, Colonoscopy, flexible; with biopsy, single                         or multiple Diagnosis Code(s):     --- Professional ---                        K52.9, Noninfective gastroenteritis and colitis,                         unspecified                        K62.5, Hemorrhage of anus and rectum CPT copyright 2022 American Medical Association. All rights reserved. The codes documented in this report are preliminary and upon coder review may  be revised to meet current compliance  requirements. Wyline Mood, MD Wyline Mood MD, MD 09/02/2023 2:01:21 PM This report has been signed electronically. Number of Addenda: 0 Note Initiated On: 09/02/2023 1:37 PM Total Procedure Duration: 0 hours 6 minutes 26 seconds  Estimated Blood Loss:  Estimated blood loss: none.      [facility]

## 2023-09-03 ENCOUNTER — Telehealth: Payer: Self-pay

## 2023-09-03 ENCOUNTER — Encounter: Payer: Self-pay | Admitting: Gastroenterology

## 2023-09-03 ENCOUNTER — Other Ambulatory Visit (HOSPITAL_COMMUNITY): Payer: Self-pay

## 2023-09-03 NOTE — Telephone Encounter (Signed)
Dr. Tobi Bastos sent me a secured chat with the following: Diana Perry adjusted her colonoscopy she appears to have severe ulcerative colitis. I have ordered a bunch of labs and stool test. She is very drowsy right now but can you call her up in the morning or later today and have her do all these tests particularly the stool tests. Once I get the results if it is ulcerative colitis and need to put her on steroids. Very likely she will need to be started on Tremfya or Skyrizi. Ensure she has a follow-up with me once we get the results to discuss about use of biologic. If my schedule is jam packed you need to move one of my patients to [place] schedule so I can find room and mine to discuss this with her.  I called patient to let her know what Dr. Tobi Bastos wanted her to do and patient agreed. I let her know that she could go to any [facility] location but that I had google [place], [place] and the closest one to her was located at: [address] [name], [place] [address]   Phone: [phone]. I will also fax the lab orders to make sure that they have them even though it's already in the system. Patient stated that she would go and have this done. I have also scheduled an appointment to come and see Dr. Tobi Bastos as recommended by [facility].

## 2023-09-04 ENCOUNTER — Ambulatory Visit: Payer: 59 | Admitting: Nurse Practitioner

## 2023-09-04 LAB — SURGICAL PATHOLOGY

## 2023-09-07 ENCOUNTER — Other Ambulatory Visit (HOSPITAL_COMMUNITY): Payer: Self-pay

## 2023-09-07 ENCOUNTER — Encounter: Payer: Self-pay | Admitting: Gastroenterology

## 2023-09-07 ENCOUNTER — Ambulatory Visit: Payer: 59 | Admitting: Gastroenterology

## 2023-09-07 VITALS — BP 95/68 | HR 83 | Temp 98.0°F | Wt 131.2 lb

## 2023-09-07 DIAGNOSIS — K529 Noninfective gastroenteritis and colitis, unspecified: Secondary | ICD-10-CM

## 2023-09-07 DIAGNOSIS — K5100 Ulcerative (chronic) pancolitis without complications: Secondary | ICD-10-CM

## 2023-09-07 NOTE — Progress Notes (Signed)
Wyline Mood MD, MRCP(U.K) [address]  [address]  [place], [place] [address]  Main: [phone]  Fax: [phone]   Primary Care Physician: Jarold Motto, [place]  Primary Gastroenterologist:  Dr. Wyline Mood   Chief Complaint  Patient presents with   Pancolitis    HPI: Diana Perry is a 22 y.o. female   Summary of history :  Initially deferred and seen on 08/31/2023 for rectal bleeding that has been going on for 6 months.  Associated with blood and mucus.  Tenesmus also noted.  Lower abdominal discomfort.  Hemoglobin 12.7 g.  CT abdomen pelvis showed mucosal thickening and hyperemia in the rectum transitioning at the rectosigmoid junction.  Interval history 08/31/2023-09/07/2023  09/02/2023: Stable sigmoidoscopy severe inflammation with loss of vascularity mucous and ulceration was found in a continuous circumferential pattern from the anus to the transverse colon no sites of spread.  Due to the severity of the inflammation I did not proceed to advance the scope to the cecum biopsies were taken.  Pathology demonstrates moderate chronic active colitis negative for granulomas dysplasia discussed with pathology no significant basal plasmacytosis and crypt distortion some features suggestive of viral cytopathic effect HSV and CMV are negative.  Was advised to rule out infective colitis HSV syphilis and chlamydia.  She has been having symptoms for over 6 months rectal bleeding tenesmus nocturnal cramping urgency 5-6 bowel movements a day.  No fevers had lost some weight gaining it back.  No family history of IBD does not smoke does not use NSAIDs alcohol makes her symptoms worse.  She does have some pain in her joints    Current Outpatient Medications  Medication Sig Dispense Refill   amphetamine-dextroamphetamine (ADDERALL) 20 MG tablet Take 1 tablet (20 mg total) by mouth daily. (Patient not taking: Reported on 09/07/2023) 90 tablet 0   No current facility-administered  medications for this visit.    Allergies as of 09/07/2023 - Review Complete 09/07/2023  Allergen Reaction Noted   Other  02/23/2018      ROS:  General: Negative for anorexia, weight loss, fever, chills, fatigue, weakness. ENT: Negative for hoarseness, difficulty swallowing , nasal congestion. CV: Negative for chest pain, angina, palpitations, dyspnea on exertion, peripheral edema.  Respiratory: Negative for dyspnea at rest, dyspnea on exertion, cough, sputum, wheezing.  GI: See history of present illness. GU:  Negative for dysuria, hematuria, urinary incontinence, urinary frequency, nocturnal urination.  Endo: Negative for unusual weight change.    Physical Examination:   BP 95/68   Pulse 83   Temp 98 F (36.7 C) (Oral)   Wt 131 lb 3.2 oz (59.5 kg)   BMI 21.83 kg/m   General: Well-nourished, well-developed in no acute distress.  Eyes: No icterus. Conjunctivae pink. Abdomen: Bowel sounds are normal, nontender, nondistended, no hepatosplenomegaly or masses, no abdominal bruits or hernia , no rebound or guarding.   Extremities: No lower extremity edema. No clubbing or deformities. Neuro: Alert and oriented x 3.  Grossly intact. Skin: Warm and dry, no jaundice.   Psych: Alert and cooperative, normal mood and affect.   Imaging Studies: CT ANGIO GI BLEED Result Date: 08/28/2023 CLINICAL DATA:  Lower GI bleed (Ped 0-17y) Pt complains of rectal bleeding that has been intermittently occurring for the last 8 months. Has been worse over the last couple of weeks. She states she is having 5 grossly bloody bowel movements daily EXAM: CTA ABDOMEN AND PELVIS WITHOUT AND WITH CONTRAST TECHNIQUE: Multidetector CT imaging of the abdomen  and pelvis was performed using the standard protocol during bolus administration of intravenous contrast. Multiplanar reconstructed images and MIPs were obtained and reviewed to evaluate the vascular anatomy. RADIATION DOSE REDUCTION: This exam was performed  according to the departmental dose-optimization program which includes automated exposure control, adjustment of the mA and/or kV according to patient size and/or use of iterative reconstruction technique. CONTRAST:  75mL OMNIPAQUE IOHEXOL 350 MG/ML SOLN COMPARISON:  Pelvic ultrasound, 06/15/2020. FINDINGS: VASCULAR Aorta: Normal caliber aorta without aneurysm, dissection, vasculitis or significant stenosis. Celiac: Patent without evidence of aneurysm, dissection, vasculitis or significant stenosis. SMA: Patent without evidence of aneurysm, dissection, vasculitis or significant stenosis. Renals: A single LEFT and 2 (main and accessory) RIGHT renal arteries are present. All renal arteries are patent without evidence of aneurysm, dissection, vasculitis, fibromuscular dysplasia or significant stenosis. IMA: Patent without evidence of aneurysm, dissection, vasculitis or significant stenosis. Pelvis: Patent without evidence of aneurysm, dissection, vasculitis or significant stenosis. Proximal Outflow: Bilateral common femoral and visualized portions of the superficial and profunda femoral arteries are patent without evidence of aneurysm, dissection, vasculitis or significant stenosis. Veins: No obvious venous abnormality within the limitations of this arterial phase study. Review of the MIP images confirms the above findings. NON-VASCULAR Lower chest: No acute abnormality. Hepatobiliary: No focal liver abnormality. No gallstones, gallbladder wall thickening, or biliary dilatation. Pancreas: No pancreatic ductal dilatation or surrounding inflammatory changes. Spleen: Normal in size without focal abnormality. Adrenals/Urinary Tract: Adrenal glands are unremarkable. Kidneys are normal, without renal calculi, focal lesion, or hydronephrosis. Bladder is unremarkable. Stomach/Bowel: Stomach is within normal limits. Appendix is not definitively visualized. Nonobstructed small bowel. Nondilated colon. Relative mucosal thinning  with hyperemia involving the rectum, with transition at the rectosigmoid junction. No evidence of bowel wall thickening, distention, or inflammatory changes. Lymphatic: No enlarged abdominal or pelvic lymph nodes. Reproductive: Uterus and adnexa are unremarkable. Other: No abdominal wall hernia or abnormality. No abdominopelvic ascites. Musculoskeletal: No acute or significant osseous findings. IMPRESSION: VASCULAR No acute vascular abnormality within the abdomen or pelvis. Specifically, no CTA evidence of acute gastrointestinal bleeding. NON-VASCULAR Suspect mucosal thinning and hyperemia at the rectum, transitioning at the rectosigmoid junction. (See key images) Consider correlation with direct visualization on sigmoidoscopy Roanna Banning, MD [facility] Electronically Signed   By: Roanna Banning M.D.   On: 08/28/2023 06:23    Assessment and Plan:   Diana Perry is a 22 y.o. y/o female follow-up after her colonoscopy which was performed to evaluate for rectal bleeding that has been going on for over 6 months.  Endoscopy features suggestive of pan ulcerative colitis, biopsies were suggestive of chronic colitis but there was some concern by pathology that we should ensure that infectious etiologies have been ruled out before we start treatment for IBD.   Plan 1.  Rule out syphilis, chlamydia, gonorrhea, HIV 2.  Obtain stool studies lab results that were requested previously once the results are back need to discuss if it is infectious or inflammatory and commence on steroids subsequently.  If this is inflammatory bowel disease very likely will require biological agents such as Tremfya 3.  Explained to her that I am going on vacation and hopefully we should have results of her labs including stool tests which have previously been ordered but not obtained since over a week and explained the importance of the same.  I have do a video visit with her on  Monday.  If there is no infection then the plan  will be to start her on steroids and obtain authorization for Tremfya   Dr Wyline Mood  MD,MRCP [facility]) Follow up in 1 week video visit

## 2023-09-08 ENCOUNTER — Other Ambulatory Visit: Payer: Self-pay | Admitting: Adult Health

## 2023-09-08 ENCOUNTER — Other Ambulatory Visit (HOSPITAL_COMMUNITY): Payer: Self-pay

## 2023-09-08 DIAGNOSIS — F4010 Social phobia, unspecified: Secondary | ICD-10-CM

## 2023-09-08 MED ORDER — ESCITALOPRAM OXALATE 10 MG PO TABS
10.0000 mg | ORAL_TABLET | Freq: Every day | ORAL | 0 refills | Status: DC
  Filled 2023-09-08: qty 90, 90d supply, fill #0

## 2023-09-10 ENCOUNTER — Telehealth: Payer: Self-pay

## 2023-09-10 ENCOUNTER — Ambulatory Visit: Payer: 59 | Admitting: Physician Assistant

## 2023-09-10 NOTE — Telephone Encounter (Signed)
Patient was contacted and she did not answer. Patient was told on Monday the importance of getting her labs drawn and turning in her stool samples and she had stated that she was going to the lab the following day (09/08/2023). Today I looked for results and she had not done it. When she was in the office, her dad and grandmother also emphasized the importance of getting the labs and stool tests done and she told them she would go the following day.  Dr. Tobi Bastos was notified.

## 2023-09-14 ENCOUNTER — Other Ambulatory Visit (HOSPITAL_COMMUNITY): Payer: Self-pay

## 2023-09-14 ENCOUNTER — Ambulatory Visit: Payer: 59 | Admitting: Gastroenterology

## 2023-09-14 DIAGNOSIS — K5100 Ulcerative (chronic) pancolitis without complications: Secondary | ICD-10-CM | POA: Diagnosis not present

## 2023-09-14 LAB — GI PROFILE, STOOL, PCR

## 2023-09-14 LAB — CALPROTECTIN, FECAL: Calprotectin, Fecal: 2160 ug/g — ABNORMAL HIGH (ref 0–120)

## 2023-09-15 ENCOUNTER — Other Ambulatory Visit: Payer: Self-pay

## 2023-09-15 ENCOUNTER — Other Ambulatory Visit (HOSPITAL_COMMUNITY): Payer: Self-pay

## 2023-09-15 MED ORDER — MESALAMINE 1.2 G PO TBEC
2.4000 g | DELAYED_RELEASE_TABLET | Freq: Two times a day (BID) | ORAL | 3 refills | Status: DC
  Filled 2023-09-15: qty 120, 30d supply, fill #0

## 2023-09-15 MED ORDER — MESALAMINE 1.2 G PO TBEC
DELAYED_RELEASE_TABLET | ORAL | 3 refills | Status: DC
  Filled 2023-09-15: qty 120, 30d supply, fill #0
  Filled 2023-11-26: qty 120, 30d supply, fill #1

## 2023-09-15 MED ORDER — MESALAMINE 1.2 G PO TBEC
1.2000 g | DELAYED_RELEASE_TABLET | Freq: Every day | ORAL | 3 refills | Status: DC
  Filled 2023-09-15: qty 120, 120d supply, fill #0

## 2023-09-18 ENCOUNTER — Other Ambulatory Visit (HOSPITAL_COMMUNITY): Payer: Self-pay

## 2023-09-24 ENCOUNTER — Ambulatory Visit: Payer: 59 | Admitting: Physician Assistant

## 2023-10-02 ENCOUNTER — Other Ambulatory Visit (HOSPITAL_COMMUNITY): Payer: Self-pay

## 2023-10-05 ENCOUNTER — Ambulatory Visit: Payer: 59 | Admitting: Gastroenterology

## 2023-10-05 ENCOUNTER — Telehealth: Payer: Self-pay | Admitting: Gastroenterology

## 2023-10-05 NOTE — Telephone Encounter (Signed)
 The patient called in to schedule an follow up with Dr. Tobi Bastos.

## 2023-10-07 LAB — CBC WITH DIFFERENTIAL/PLATELET
Basophils Absolute: 0.1 10*3/uL (ref 0.0–0.2)
Basos: 1 %
EOS (ABSOLUTE): 0.4 10*3/uL (ref 0.0–0.4)
Eos: 5 %
Hematocrit: 34.5 % (ref 34.0–46.6)
Hemoglobin: 11.4 g/dL (ref 11.1–15.9)
Immature Grans (Abs): 0 10*3/uL (ref 0.0–0.1)
Immature Granulocytes: 1 %
Lymphocytes Absolute: 1.3 10*3/uL (ref 0.7–3.1)
Lymphs: 16 %
MCH: 29.8 pg (ref 26.6–33.0)
MCHC: 33 g/dL (ref 31.5–35.7)
MCV: 90 fL (ref 79–97)
Monocytes Absolute: 1.1 10*3/uL — ABNORMAL HIGH (ref 0.1–0.9)
Monocytes: 13 %
Neutrophils Absolute: 5.6 10*3/uL (ref 1.4–7.0)
Neutrophils: 64 %
Platelets: 457 10*3/uL — ABNORMAL HIGH (ref 150–450)
RBC: 3.83 x10E6/uL (ref 3.77–5.28)
RDW: 12 % (ref 11.7–15.4)
WBC: 8.5 10*3/uL (ref 3.4–10.8)

## 2023-10-07 LAB — HEPATITIS B SURFACE ANTIGEN: Hepatitis B Surface Ag: NEGATIVE

## 2023-10-07 LAB — VITAMIN D 1,25 DIHYDROXY
Vitamin D 1, 25 (OH)2 Total: 116 pg/mL — ABNORMAL HIGH
Vitamin D2 1, 25 (OH)2: <10 pg/mL
Vitamin D3 1, 25 (OH)2: 116 pg/mL

## 2023-10-07 LAB — HEPATITIS C ANTIBODY: Hep C Virus Ab: NONREACTIVE

## 2023-10-07 LAB — QUANTIFERON-TB GOLD PLUS
QuantiFERON Mitogen Value: 0.75 [IU]/mL
QuantiFERON Nil Value: 0.02 [IU]/mL
QuantiFERON TB1 Ag Value: 0.02 [IU]/mL
QuantiFERON TB2 Ag Value: 0.03 [IU]/mL
QuantiFERON-TB Gold Plus: NEGATIVE

## 2023-10-07 LAB — HEPATITIS B SURFACE ANTIBODY,QUALITATIVE: Hep B Surface Ab, Qual: NONREACTIVE

## 2023-10-07 LAB — HEPATITIS B E ANTIGEN: Hep B E Ag: NEGATIVE

## 2023-10-07 LAB — C-REACTIVE PROTEIN: CRP: 16 mg/L — ABNORMAL HIGH (ref 0–10)

## 2023-10-07 LAB — HEPATITIS B E ANTIBODY: Hep B E Ab: NONREACTIVE

## 2023-10-07 LAB — HIV ANTIBODY (ROUTINE TESTING W REFLEX): HIV Screen 4th Generation wRfx: NONREACTIVE

## 2023-10-07 LAB — HEPATITIS A ANTIBODY, TOTAL: hep A Total Ab: POSITIVE — AB

## 2023-10-07 LAB — HEPATITIS B CORE ANTIBODY, TOTAL: Hep B Core Total Ab: NEGATIVE

## 2023-10-07 NOTE — Telephone Encounter (Signed)
 Called patient and had to leave her a voicemail letting her know that she is needing to come in the office to do the rectal swab test in order for Dr. Antony Baumgartner to be able to treat her symptoms. I left her my name and number and I will also send her a MyChart message.

## 2023-10-13 ENCOUNTER — Other Ambulatory Visit: Payer: Self-pay

## 2023-10-13 ENCOUNTER — Encounter: Payer: Self-pay | Admitting: Infectious Diseases

## 2023-10-13 ENCOUNTER — Telehealth: Payer: Self-pay | Admitting: Physician Assistant

## 2023-10-13 DIAGNOSIS — K625 Hemorrhage of anus and rectum: Secondary | ICD-10-CM | POA: Diagnosis not present

## 2023-10-13 DIAGNOSIS — K5100 Ulcerative (chronic) pancolitis without complications: Secondary | ICD-10-CM | POA: Diagnosis not present

## 2023-10-13 NOTE — Telephone Encounter (Signed)
Symptom Update.  FYI: Patient reported she is taking Mesalamine (Lialda) 1.2g, 2 tablets daily and her GI symptoms have greatly improved.  She has no more diarrhea or rectal bleeding.  She is feeling a lot better since starting Mesalamine.  Celso Amy, PA-C

## 2023-10-13 NOTE — Addendum Note (Signed)
Addended by: Adela Ports on: 10/13/2023 04:46 PM   Modules accepted: Orders

## 2023-10-14 LAB — CT/NG RNA, TMA RECTAL

## 2023-10-15 ENCOUNTER — Telehealth: Payer: Self-pay

## 2023-10-15 NOTE — Telephone Encounter (Signed)
Dr. Tobi Bastos sent me a message about patient's lab (rectal swab test) being cancelled. He asked for me to figure out as in why. I then called [facility] and spoke with a customer service rep-Jody and asked for her to check why we were not able to get results. Jody repeated what was on the report and I told her that we had received the same information but wasn't quiet explaining the "why" of cancellation. She then asked me to put me on hold and stated that she would contact the lab and ask. She then came back and stated that she was not able to communicate with anybody but she asked if there was a possibility for a lab tech/supervisor or doctor to call Dr. Tobi Bastos and hopefully explain why it was cancelled. I provided her with Dr. Johnney Killian cell phone. I then messaged Dr. Tobi Bastos and notified him what was stated. Now we wait.

## 2023-10-18 LAB — CT/NG RNA, TMA RECTAL
Chlamydia trachomatis, NAA: NEGATIVE
Neisseria gonorrhoeae, NAA: NEGATIVE

## 2023-10-19 ENCOUNTER — Ambulatory Visit: Payer: 59 | Admitting: Gastroenterology

## 2023-10-20 ENCOUNTER — Ambulatory Visit (INDEPENDENT_AMBULATORY_CARE_PROVIDER_SITE_OTHER): Payer: 59 | Admitting: Gastroenterology

## 2023-10-20 ENCOUNTER — Encounter: Payer: Self-pay | Admitting: Gastroenterology

## 2023-10-20 VITALS — BP 110/66 | HR 76 | Temp 98.6°F | Wt 136.8 lb

## 2023-10-20 DIAGNOSIS — K6289 Other specified diseases of anus and rectum: Secondary | ICD-10-CM | POA: Diagnosis not present

## 2023-10-20 DIAGNOSIS — K529 Noninfective gastroenteritis and colitis, unspecified: Secondary | ICD-10-CM

## 2023-10-20 DIAGNOSIS — K5100 Ulcerative (chronic) pancolitis without complications: Secondary | ICD-10-CM

## 2023-10-20 NOTE — Progress Notes (Signed)
Wyline Mood MD, MRCP(U.K) [address]  [address]  [place], [place] [address]  Main: [phone]  Fax: [phone]   Primary Care Physician: Jarold Motto, [place]  Primary Gastroenterologist:  Dr. Wyline Mood   Chief Complaint  Patient presents with   Go over results    HPI: Diana Perry is a 23 y.o. female Summary of history :   Initially deferred and seen on 08/31/2023 for rectal bleeding, tenesmus, nocturnal cramping that has been going on for 6 months.  Associated with blood and mucus.  Tenesmus also noted.  Lower abdominal discomfort.  Hemoglobin 12.7 g.  CT abdomen pelvis showed mucosal thickening and hyperemia in the rectum transitioning at the rectosigmoid junction.  09/02/2023: sigmoidoscopy severe inflammation with loss of vascularity mucous and ulceration was found in a continuous circumferential pattern from the anus to the transverse colon no sites of spread.  Due to the severity of the inflammation I did not proceed to advance the scope to the cecum biopsies were taken.  Pathology demonstrates moderate chronic active colitis negative for granulomas dysplasia discussed with pathology no significant basal plasmacytosis and crypt distortion some features suggestive of viral cytopathic effect HSV and CMV are negative.  Was advised to rule out infective colitis HSV syphilis and chlamydia.  She has been having symptoms for over 6 months rectal bleeding tenesmus nocturnal cramping urgency 5-6 bowel movements a day.  No fevers had lost some weight gaining it back.  No family history of IBD does not smoke does not use NSAIDs alcohol makes her symptoms worse.  Interval history 09/07/2023-10/20/2023   09/10/2023: Stool GI PCR negative.  09/14/2023: Vit D normal. HIV/Hep C/TB quantiferon/Hep B- negative. Immune to Hep A but not to Hep B. CRP 16 , Hb 11.4,    10/13/2023: Rectal swab : negative for chlamydia and gonorrhea.    She states that since she started on mesalamine a  few weeks back all her GI symptoms are completely resolved has 1 formed nonbloody bowel movement a day no nocturnal symptoms no urgency.  Feels like she never had the disease. Current Outpatient Medications  Medication Sig Dispense Refill   mesalamine (LIALDA) 1.2 g EC tablet Take 2 tablets (2.4 g) by mouth twice daily( 4.8g total daily). 120 tablet 3   amphetamine-dextroamphetamine (ADDERALL) 20 MG tablet Take 1 tablet (20 mg total) by mouth daily. (Patient not taking: Reported on 09/07/2023) 90 tablet 0   escitalopram (LEXAPRO) 10 MG tablet Take 1 tablet (10 mg total) by mouth daily. (Patient not taking: Reported on 10/20/2023) 90 tablet 0   No current facility-administered medications for this visit.    Allergies as of 10/20/2023 - Review Complete 10/20/2023  Allergen Reaction Noted   Other  02/23/2018     ROS:  General: Negative for anorexia, weight loss, fever, chills, fatigue, weakness. ENT: Negative for hoarseness, difficulty swallowing , nasal congestion. CV: Negative for chest pain, angina, palpitations, dyspnea on exertion, peripheral edema.  Respiratory: Negative for dyspnea at rest, dyspnea on exertion, cough, sputum, wheezing.  GI: See history of present illness. GU:  Negative for dysuria, hematuria, urinary incontinence, urinary frequency, nocturnal urination.  Endo: Negative for unusual weight change.    Physical Examination:   BP 110/66   Pulse 76   Temp 98.6 F (37 C) (Oral)   Wt 136 lb 12.8 oz (62.1 kg)   BMI 22.76 kg/m   General: Well-nourished, well-developed in no acute distress.  Eyes: No icterus. Conjunctivae pink. Mouth: Oropharyngeal mucosa  moist and pink , no lesions erythema or exudate. Extremities: No lower extremity edema. No clubbing or deformities. Neuro: Alert and oriented x 3.  Grossly intact. Skin: Warm and dry, no jaundice.   Psych: Alert and cooperative, normal mood and affect.   Imaging Studies: No results found.  Assessment and  Plan:   Diana Perry is a 23 y.o. y/o female here to follow-up after her colonoscopy which was performed to evaluate for rectal bleeding that has been going on for over 6 months.  Endoscopy features suggestive of pan ulcerative colitis, biopsies were suggestive of chronic colitis but there was some concern by pathology that we should ensure that infectious etiologies have been ruled out before we start treatment for IBD.  Nausea and chlamydia have been ruled out     Plan 1.  Hep B vaccine. , RPR test to screen for syphilis -  2.  Initially based on her pancolitis I felt we need to start her on a biologic agent because she had severe colitis but she seems to say at this point of time that she has absolutely no GI symptoms and that her mesalamine has completely helped resolve everything.  I am wondering if the mesalamine could have potentially put her into remission at this point of time.  Her fecal calprotectin was 2100 in December 2024.  I will plan to recheck it now and we see a trend which is significantly improved we could probably hold off on a biologic agent and probably perform a colonoscopy 6 months from now to check for endoscopy remission and decide on stepping up of therapy at that point of time.   Dr Wyline Mood  MD,MRCP [facility]) Follow up in 3 weeks after blood work to discuss if escalation of therapy is needed

## 2023-10-29 DIAGNOSIS — K5100 Ulcerative (chronic) pancolitis without complications: Secondary | ICD-10-CM | POA: Diagnosis not present

## 2023-11-01 LAB — RPR: RPR Ser Ql: NONREACTIVE

## 2023-11-01 LAB — C-REACTIVE PROTEIN: CRP: <1 mg/L (ref 0–10)

## 2023-11-01 LAB — CALPROTECTIN, FECAL: Calprotectin, Fecal: 185 ug/g — ABNORMAL HIGH (ref 0–120)

## 2023-11-12 ENCOUNTER — Telehealth: Payer: 59 | Admitting: Gastroenterology

## 2023-11-12 DIAGNOSIS — K5100 Ulcerative (chronic) pancolitis without complications: Secondary | ICD-10-CM | POA: Diagnosis not present

## 2023-11-12 NOTE — Addendum Note (Signed)
Addended by: Adela Ports on: 11/12/2023 01:48 PM   Modules accepted: Orders

## 2023-11-12 NOTE — Progress Notes (Signed)
Diana Perry , MD [address]  [address]  [place], [place] [address]  Main: [phone]  Fax: [phone]   Primary Care Physician: Jarold Motto, [place]  Virtual Visit via Video Note  I connected with patient on 11/12/23 at  1:30 PM EST by video and verified that I am speaking with the correct person using two identifiers.   I discussed the limitations, risks, security and privacy concerns of performing an evaluation and management service by video  and the availability of in person appointments. I also discussed with the patient that there may be a patient responsible charge related to this service. The patient expressed understanding and agreed to proceed.  Location of Patient: Home Location of Provider: Home Persons involved: Patient and provider only   History of Present Illness: Chief Complaint  Patient presents with   ulcerative pancolitis    HPI: Diana Perry is a 23 y.o. female  Summary of history :   Initially deferred and seen on 08/31/2023 for rectal bleeding, tenesmus, nocturnal cramping that has been going on for 6 months.  Associated with blood and mucus.  Tenesmus also noted.  Lower abdominal discomfort.  Hemoglobin 12.7 g.  CT abdomen pelvis showed mucosal thickening and hyperemia in the rectum transitioning at the rectosigmoid junction.   09/02/2023: sigmoidoscopy severe inflammation with loss of vascularity mucous and ulceration was found in a continuous circumferential pattern from the anus to the transverse colon no sites of spread.  Due to the severity of the inflammation I did not proceed to advance the scope to the cecum biopsies were taken.  Pathology demonstrates moderate chronic active colitis negative for granulomas dysplasia discussed with pathology no significant basal plasmacytosis and crypt distortion some features suggestive of viral cytopathic effect HSV and CMV are negative.  Was advised to rule out infective colitis HSV syphilis and  chlamydia.  She has been having symptoms for over 6 months rectal bleeding tenesmus nocturnal cramping urgency 5-6 bowel movements a day.  No fevers had lost some weight gaining it back.  No family history of IBD does not smoke does not use NSAIDs alcohol makes her symptoms worse.    09/10/2023: Stool GI PCR negative.  09/14/2023: Vit D normal. HIV/Hep C/TB quantiferon/Hep B- negative. Immune to Hep A but not to Hep B. CRP 16 , Hb 11.4,    10/13/2023: Rectal swab : negative for chlamydia and gonorrhea.    Interval history 10/20/2023-11/12/2023    2/6/20205: Fecal Cal : 2160--->185,CP <1.  Feels " completely normal " , 1 bowel movement a day - formed - no blood - no urgency - no nocturnal symptoms. No nsaid use.   No issues   Taking mesalamine BID.   Current Outpatient Medications  Medication Sig Dispense Refill   amphetamine-dextroamphetamine (ADDERALL) 20 MG tablet Take 1 tablet (20 mg total) by mouth daily. 90 tablet 0   escitalopram (LEXAPRO) 10 MG tablet Take 1 tablet (10 mg total) by mouth daily. 90 tablet 0   mesalamine (LIALDA) 1.2 g EC tablet Take 2 tablets (2.4 g) by mouth twice daily( 4.8g total daily). 120 tablet 3   No current facility-administered medications for this visit.    Allergies as of 11/12/2023 - Review Complete 10/20/2023  Allergen Reaction Noted   Other  02/23/2018    Review of Systems:    All systems reviewed and negative except where noted in HPI.  General Appearance:    Alert, cooperative, no distress, appears stated age  Head:  Normocephalic, without obvious abnormality, atraumatic  Eyes:    PERRL, conjunctiva/corneas clear,  Ears:    Grossly normal hearing    Neurologic:  Grossly normal    Observations/Objective:  Labs: CMP     Component Value Date/Time   NA 136 08/27/2023 1703   K 3.5 08/27/2023 1703   CL 108 08/27/2023 1703   CO2 25 08/27/2023 1703   GLUCOSE 97 08/27/2023 1703   BUN 8 08/27/2023 1703   CREATININE 0.78 08/27/2023  1703   CREATININE 0.72 06/15/2020 1201   CALCIUM 8.7 (L) 08/27/2023 1703   PROT 6.4 (L) 08/27/2023 1703   ALBUMIN 3.7 08/27/2023 1703   AST 21 08/27/2023 1703   ALT 16 08/27/2023 1703   ALKPHOS 34 (L) 08/27/2023 1703   BILITOT 0.2 08/27/2023 1703   GFRNONAA >60 08/27/2023 1703   GFRNONAA 124 09/04/2018 0527   GFRAA 144 09/04/2018 0527   Lab Results  Component Value Date   WBC 8.5 09/14/2023   HGB 11.4 09/14/2023   HCT 34.5 09/14/2023   MCV 90 09/14/2023   PLT 457 (H) 09/14/2023    Imaging Studies: No results found.  Assessment and Plan:   Diana Perry is a 23 y.o. y/o  here to follow-up after her colonoscopy which was performed to evaluate for rectal bleeding that has been going on for over 6 months.  Endoscopy features suggestive of pan ulcerative colitis, biopsies were suggestive of chronic colitis but there was some concern by pathology that we should ensure that infectious etiologies have been ruled out before we start treatment for IBD.  Nausea and chlamydia have been ruled out, syphilis has been ruled out.  Initially the plan was started on a biological agent while pending the lab work.  In the interim we commenced her her on mesalamine.  Surprisingly she has been doing extremely well and clinically appears to be in remission.  Her fecal calprotectin has dropped by over 90%.  CRP is less than 1.  She has no symptoms whatsoever.  I would suggest she continue the mesalamine at this point of time.  We will check her CBC and CMP in 6 weeks time.  Follow-up visit in 2 months to talk about health maintenance and the eventual plan will be for a colonoscopy in June 2025 to look for endoscopy remission.      I discussed the assessment and treatment plan with the patient. The patient was provided an opportunity to ask questions and all were answered. The patient agreed with the plan and demonstrated an understanding of the instructions.   The patient was advised to call back or seek an  in-person evaluation if the symptoms worsen or if the condition fails to improve as anticipated.  I provided 12 minutes of face-to-face time during this encounter.  Dr Diana Mood MD,MRCP [facility]) Gastroenterology/Hepatology Pager: [phone]   Speech recognition software was used to dictate this note.

## 2023-11-28 ENCOUNTER — Other Ambulatory Visit (HOSPITAL_COMMUNITY): Payer: Self-pay

## 2023-12-02 ENCOUNTER — Telehealth: Payer: Self-pay | Admitting: Gastroenterology

## 2023-12-02 ENCOUNTER — Other Ambulatory Visit (HOSPITAL_COMMUNITY): Payer: Self-pay

## 2023-12-02 MED ORDER — MESALAMINE ER 0.375 G PO CP24
750.0000 mg | ORAL_CAPSULE | Freq: Three times a day (TID) | ORAL | 2 refills | Status: DC
  Filled 2023-12-02: qty 90, 15d supply, fill #0

## 2023-12-02 NOTE — Addendum Note (Signed)
 Addended by: Radene Knee L on: 12/02/2023 04:28 PM   Modules accepted: Orders

## 2023-12-02 NOTE — Telephone Encounter (Signed)
 Called and informed patient farther and he verbalized understanding

## 2023-12-02 NOTE — Telephone Encounter (Signed)
 Patient farther Diana Perry left a voicemail and states he talk to someone this morning and they were going to get a message to Dr Tobi Bastos staff but he has not heard anything back. Called Diana Perry back and he states that he talk to Dr. Tobi Bastos over the weekend about his daughter medication and Dr. Tobi Bastos said he was going to send a message to staff on Monday. Asked him what medication it was He states the Mesalamine 1.2grams and with there insurance now it is so to expensive and they can not afford it. They are wondering if it can be change to something cheaper. He states his daughter is out of medication at this time

## 2023-12-02 NOTE — Telephone Encounter (Signed)
 Switch to Apriso 750 mg, 3 times daily, 90 days with 2 refills  RV

## 2023-12-07 ENCOUNTER — Other Ambulatory Visit (HOSPITAL_COMMUNITY): Payer: Self-pay

## 2023-12-07 MED ORDER — SULFASALAZINE 500 MG PO TABS
500.0000 mg | ORAL_TABLET | Freq: Four times a day (QID) | ORAL | 11 refills | Status: AC
  Filled 2023-12-07 (×2): qty 120, 30d supply, fill #0
  Filled 2024-03-08: qty 120, 30d supply, fill #1
  Filled 2024-09-20: qty 120, 30d supply, fill #2

## 2023-12-07 NOTE — Addendum Note (Signed)
 Addended by: Adela Ports on: 12/07/2023 05:05 PM   Modules accepted: Orders

## 2023-12-07 NOTE — Telephone Encounter (Signed)
 Called Diana Perry-patient's dad back and let him know that Dr. Tobi Bastos recommended two other medications that could be more economical and he stated that we could try sending sulfasalazine first and to go from there.

## 2023-12-07 NOTE — Telephone Encounter (Signed)
 Pt's father, Thayer Ohm called stating the Judd Lien is also too expensive. Is there any other medication we can try and send to the pharmacy?

## 2023-12-08 ENCOUNTER — Other Ambulatory Visit (HOSPITAL_COMMUNITY): Payer: Self-pay

## 2023-12-08 ENCOUNTER — Telehealth: Payer: Self-pay

## 2023-12-08 NOTE — Telephone Encounter (Signed)
 Patient has been scheduled for a new patient appt with Dr. Doy Hutching on Wednesday, 12/23/23 at 2:10 pm, 2 pm arrival. I called and left patient a detailed vm with appt information. I asked that patient please call back to confirm appt. If this appt does not work we can reschedule to a more convenient time for patient.

## 2023-12-08 NOTE — Telephone Encounter (Signed)
-----   Message from Ottie Glazier sent at 12/08/2023  7:06 AM EDT ----- Regarding: New Patient Appointment Hi nurses -  One of the [facility] procedure nurses -Rogue Jury -  asked if I would see her adult daughter who was recently diagnosed with ulcerative colitis. She was seen by another GI in [place].  I told her I would be willing to see her and assume her care.  Patient is Diana Perry Phone: [phone]  Please review my schedule availability for a clinic appointment in April 2025 and offer her an appointment.  If there are no available openings let me know and I can review where to place her in an add-on slot.  Please let me know if you have any questions.  Thanks,  Harriett Sine

## 2023-12-09 NOTE — Telephone Encounter (Signed)
 Patient called back to confirm her appointment for April 2 nd at 2:10 PM. Please advise.

## 2023-12-09 NOTE — Telephone Encounter (Signed)
 Noted. Thanks.

## 2023-12-14 ENCOUNTER — Other Ambulatory Visit (HOSPITAL_COMMUNITY): Payer: Self-pay

## 2023-12-21 NOTE — Progress Notes (Unsigned)
 [facility] Initial Consultation   Referring Provider Jarold Motto, [place] [address] [place],  [place] [address]  Primary Care Provider Jarold Motto, [place]  Patient Profile: Diana Perry. [place] is a 23 y.o. female who is self referred to the [facility] office for evaluation and management of the problem(s) noted below.  Problem List: Pan ulcerative colitis diagnosed 08/2023 Hepatitis B nonimmune   History of Present Illness   Diana Perry is a 23 y.o. female with a history of anxiety and ADD who presents to the gastroenterology office for evaluation and management of pan ulcerative colitis diagnosed in 08/2023.  In speaking with Kamalani as well as reviewing her prior medical records, her IBD history is noteworthy for the following:  -- 6 months of rectal bleeding, tenesmus, nocturnal cramping in 2024 -- Fecal cal 08/2023   2160; stool GI PCR negative -- CTAP 08/2023  - hyperemia of the rectum and rectosigmoid junction -- Colonoscopy 08/2023- severe inflammation from the anus to transverse colon -aborted in transverse colon due to degree of inflammation --Tx'd with Lialda 4.8 g daily -- Fecal cal 10/2023  185   Last colonoscopy:  08/2023 -severe inflammation extending from the rectum to transverse colon Last endoscopy: None  Last Abd CT/CTE/MRE:  08/2023 -hyperemia of the rectum and rectosigmoid junction  GI Review of Symptoms Significant for {GIROS:50592}. Otherwise negative.  General Review of Systems  Review of systems is significant for the pertinent positives and negatives as listed per the HPI.  Full ROS is otherwise negative.  Inflammatory Bowel Disease History  ***[place] point chronologic history***  IBD Medication History  Past Medical History   Past Medical History:  Diagnosis Date   ADD (attention deficit disorder) 05/2018   Anxiety      Past Surgical History   Past Surgical History:  Procedure Laterality Date   BIOPSY   09/02/2023   Procedure: BIOPSY;  Surgeon: Wyline Mood, MD;  Location: [facility] ENDOSCOPY;  Service: Gastroenterology;;   COLONOSCOPY WITH PROPOFOL N/A 09/02/2023   Procedure: COLONOSCOPY WITH PROPOFOL;  Surgeon: Wyline Mood, MD;  Location: [facility] ENDOSCOPY;  Service: Gastroenterology;  Laterality: N/A;   WISDOM TOOTH EXTRACTION       Allergies and Medications   Allergies  Allergen Reactions   Other     apples    @MEDSTODAY @  Family History   Family History  Problem Relation Age of Onset   BRCA 1/2 Mother    Anxiety disorder Mother    Healthy Father      Social History   Social History   Tobacco Use   Smoking status: Every Day    Types: E-cigarettes   Smokeless tobacco: Never  Vaping Use   Vaping status: Every Day   Substances: Nicotine, Flavoring  Substance Use Topics   Alcohol use: Never   Drug use: Never   Lonette reports that she has been smoking e-cigarettes. She has never used smokeless tobacco. She reports that she does not drink alcohol and does not use drugs.  Vital Signs and Physical Examination  There were no vitals filed for this visit. There is no height or weight on file to calculate BMI.    General: Well developed, well nourished, no acute distress Head: Normocephalic and atraumatic Eyes: Sclerae anicteric, EOMI Ears: Normal auditory acuity Mouth: No deformities or lesions noted Lungs: Clear throughout to auscultation Heart: Regular rate and rhythm; No murmurs, rubs or bruits Abdomen: Soft, non tender and non distended. No masses, hepatosplenomegaly or hernias noted. Normal Bowel sounds  Rectal: Musculoskeletal: Symmetrical with no gross deformities  Pulses:  Normal pulses noted Extremities: No edema or deformities noted Neurological: Alert oriented x 4, grossly nonfocal Psychological:  Alert and cooperative. Normal mood and affect  Review of Data  The following data was reviewed at the time of this encounter:  Laboratory Studies      Latest  Ref Rng & Units 09/14/2023    2:58 PM 08/31/2023    2:21 PM 08/27/2023    5:03 PM  CBC  WBC 3.4 - 10.8 x10E3/uL 8.5  10.3  12.7   Hemoglobin 11.1 - 15.9 g/dL 09.8  11.9  14.7   Hematocrit 34.0 - 46.6 % 34.5  40.1  39.9   Platelets 150 - 450 x10E3/uL 457  338  339     Lab Results  Component Value Date   LIPASE 31 08/27/2023      Latest Ref Rng & Units 08/27/2023    5:03 PM 06/15/2020   12:01 PM 09/04/2018    5:27 AM  CMP  Glucose 70 - 99 mg/dL 97  88  72   BUN 6 - 20 mg/dL 8  13  11    Creatinine 0.44 - 1.00 mg/dL 8.29  5.62  1.30   Sodium 135 - 145 mmol/L 136  138  140   Potassium 3.5 - 5.1 mmol/L 3.5  3.8  5.7   Chloride 98 - 111 mmol/L 108  103  107   CO2 22 - 32 mmol/L 25  25  26    Calcium 8.9 - 10.3 mg/dL 8.7  9.7  9.9   Total Protein 6.5 - 8.1 g/dL 6.4  7.3  6.9   Total Bilirubin <1.2 mg/dL 0.2  0.9  0.4   Alkaline Phos 38 - 126 U/L 34     AST 15 - 41 U/L 21  17  17    ALT 0 - 44 U/L 16  11  12     IBD Labs  Prebiologic Labs @LASTLAB (HBSAG,HEPBSAB,HEPBSAB,HBCAB,,QUANTIFERON,QUANTIFERON)@  Therapeutic Drug Monitoring @LASTLAB (TPMTACT)@ Thiopurine metabolite levels:  Date:                6-TGN***       6-MMP***  Biologic level and antibodies:***  Fecal Calprotectin @LASTLAB (FECALCAL)@  Imaging Studies  CT Angio 08/2023 IMPRESSION: VASCULAR   No acute vascular abnormality within the abdomen or pelvis.   Specifically, no CTA evidence of acute gastrointestinal bleeding.   NON-VASCULAR   Suspect mucosal thinning and hyperemia at the rectum, transitioning at the rectosigmoid junction. (See key images)   Consider correlation with direct visualization on sigmoidoscopy  GI Procedures and Studies   Colonoscopy 08/2023 severe inflammation from the anus to transverse colon -aborted in transverse colon due to degree of inflammation Path: Moderate chronic active colitis in the rectosigmoid and descending colon   Clinical Impression  It is my clinical impression  that Diana Perry is a 23 y.o. female with;  ***  Plan  *** *** *** *** ***    IBD Health Maintenance  Vaccinations Influenza: PCV13: PPSV23: COVID19: HAV/HBV: Shingles: HPV:  DEXA Date:                            Result:  Pap Smear   Eye Exam   Skin Exam   Surveillance Colonoscopy   Tobacco Use   Depression Screen     Planned Follow Up No follow-ups on file.  The patient or caregiver verbalized understanding of the material covered, with  no barriers to understanding. All questions were answered. Patient or caregiver is agreeable with the plan outlined above.    It was a pleasure to see Vivien.  If you have any questions or concerns regarding this evaluation, do not hesitate to contact me.  Maren Beach, MD [facility]

## 2023-12-23 ENCOUNTER — Ambulatory Visit: Admitting: Pediatrics

## 2023-12-23 ENCOUNTER — Encounter: Payer: Self-pay | Admitting: Pediatrics

## 2023-12-23 VITALS — BP 120/68 | HR 107 | Ht 66.0 in | Wt 140.0 lb

## 2023-12-23 DIAGNOSIS — M545 Low back pain, unspecified: Secondary | ICD-10-CM

## 2023-12-23 DIAGNOSIS — K5100 Ulcerative (chronic) pancolitis without complications: Secondary | ICD-10-CM

## 2023-12-23 DIAGNOSIS — G8929 Other chronic pain: Secondary | ICD-10-CM

## 2023-12-23 DIAGNOSIS — M549 Dorsalgia, unspecified: Secondary | ICD-10-CM | POA: Diagnosis not present

## 2023-12-23 NOTE — Patient Instructions (Addendum)
 Increase sulfasalazine to 2 tablets 4 times per day.  Your provider has requested that you go to the basement level for lab work before leaving today. Press "B" on the elevator. The lab is located at the first door on the left as you exit the elevator.  Due to recent changes in healthcare laws, you may see the results of your imaging and laboratory studies on MyChart before your provider has had a chance to review them.  We understand that in some cases there may be results that are confusing or concerning to you. Not all laboratory results come back in the same time frame and the provider may be waiting for multiple results in order to interpret others.  Please give [place] 48 hours in order for your provider to thoroughly review all the results before contacting the office for clarification of your results.   Follow up with our office in 2 months.  Thank you for entrusting me with your care and for choosing [facility], Dr. Maren Beach   _______________________________________________________  If your blood pressure at your visit was 140/90 or greater, please contact your primary care physician to follow up on this.  _______________________________________________________  If you are age 24 or older, your body mass index should be between 23-30. Your Body mass index is 22.6 kg/m. If this is out of the aforementioned range listed, please consider follow up with your Primary Care Provider.  If you are age 24 or younger, your body mass index should be between 19-25. Your Body mass index is 22.6 kg/m. If this is out of the aformentioned range listed, please consider follow up with your Primary Care Provider.   ________________________________________________________  The [facility] providers would like to encourage you to use [facility] to communicate with providers for non-urgent requests or questions.  Due to long hold times on the telephone, sending your provider a message by [facility] may  be a faster and more efficient way to get a response.  Please allow 48 business hours for a response.  Please remember that this is for non-urgent requests.  _______________________________________________________

## 2024-02-22 ENCOUNTER — Ambulatory Visit: Admitting: Physician Assistant

## 2024-02-22 NOTE — Progress Notes (Deleted)
 02/22/2024 Diana Perry [id number] Mar 26, 2001  Referring provider: Alexander Iba, PA Primary GI doctor: Dr. Yvone Herd  ASSESSMENT AND PLAN:     Patient Care Team: Alexander Iba, [place] as PCP - General (Physician Assistant)  HISTORY OF PRESENT ILLNESS: 23 y.o. female presents for evaluation of panulcerative colitis. Last seen in the office on ***.   IBD history: Diagnosed 08/2023 with rectal bleeding, tenesmus, nocturnal cramping in 2024  -- 6 months of rectal bleeding, tenesmus, nocturnal cramping in 2024 -- Fecal cal 08/2023   2160; stool GI PCR negative -- CTAP 08/2023  - hyperemia of the rectum and rectosigmoid junction -- Colonoscopy 08/2023- severe inflammation from the anus to transverse colon -aborted in transverse colon due to degree of inflammation -- Tx'd with Lialda  4.8 g daily and ? Rectal tx -no documented steroid use -- Fecal cal 10/2023  185 --> transitioned to SAZ 500 mg p.o. 4 times daily for insurance purposes 12/23/2023 sulfasalazine  increased 1 g 4 times daily, long-term will start folic acid 1 mg p.o. daily.  IBD Medication History Lialda  with response to oral mesalamine  Transitioned to sulfasalazine  spring 2025 due to insurance  Last colonoscopy:   Colonoscopy 08/2023- severe inflammation from the anus to transverse colon -aborted in transverse colon due to degree of inflammation  Last small bowel imaging:   CTAP 08/2023  - hyperemia of the rectum and rectosigmoid junction  Extraintestinal manifestations: {acextraintestinal symptoms:26754} Surgical history: {ibd surg:17890}.  Other significant medical history:  Current History Discussed the use of AI scribe software for clinical note transcription with the patient, who gave verbal consent to proceed.  History of Present Illness           ***  Recent labs:  10/29/2023 CRP <1  Sed rate  Fecal cal 2160--> 10/29/23 185 09/14/2023 WBC 8.5 HGB 11.4 MCV 90 Platelets 457 08/31/2023 Iron 29  Ferritin 28  B12 No results  FOLATE No results  08/27/2023 AST 21 ALT 16 Alkphos 34 TBili 0.2 VITAMIN D  116 09/14/2023  TB GOLD 09/14/2023 Negative or TB skin if indeterminate.  HepBsAG 09/14/2023 Negative  TPMT Activity: No results found for requested labs within last 1095 days.  IBD Health Care Maintenance: IBD Health Maintenance  Vaccinations Influenza: PCV13: PPSV23: COVID19: HAV/HBV: Advise future HBV vaccination Shingles: HPV:  DEXA PRN  Pap Smear    Eye Exam PRN  Skin Exam PRN  Surveillance Colonoscopy Due 2032-3034  Tobacco Use Vapes  Depression Screen      Immunization History  Administered Date(s) Administered   DTaP 09/25/2000, 11/24/2000, 02/01/2001, 04/13/2002, 10/21/2005   HIB (PRP-OMP) 09/25/2000, 11/24/2000, 02/01/2001, 10/15/2001   HIB, Unspecified 09/25/2000, 11/24/2000, 02/01/2001, 10/15/2001   HPV 9-valent 06/01/2017, 05/13/2019, 11/15/2019   Hepatitis A, Ped/Adol-2 Dose 11/02/2007, 05/13/2010   Hepatitis B, PED/ADOLESCENT 10/26/2000, 01/01/2001, 05/06/2001   IPV 09/25/2000, 11/24/2000, 02/01/2001, 10/21/2005   Influenza,inj,Quad PF,6+ Mos 06/15/2020   Influenza,inj,quad, With Preservative 09/02/2005   Influenza,trivalent, recombinat, inj, PF 09/02/2005   MMR 10/15/2001, 10/21/2005   Meningococcal Acwy, Unspecified 06/18/2012   Meningococcal Conjugate 06/18/2012, 06/01/2017   PFIZER(Purple Top)SARS-COV-2 Vaccination 03/22/2020, 04/22/2020   Pneumococcal Conjugate-13 09/25/2000, 11/24/2000, 02/01/2001, 10/15/2001   Tdap 06/18/2012   Varicella 10/15/2001, 10/21/2005    RELEVANT GI HISTORY, LABS, IMAGING:  CBC    Component Value Date/Time   WBC 8.5 09/14/2023 1458   WBC 12.7 (H) 08/27/2023 1703   RBC 3.83 09/14/2023 1458   RBC 4.40 08/27/2023 1703   HGB 11.4 09/14/2023 1458   HCT 34.5 09/14/2023  1458   PLT 457 (H) 09/14/2023 1458   MCV 90 09/14/2023 1458   MCH 29.8 09/14/2023 1458   MCH 30.7 08/27/2023 1703   MCHC 33.0 09/14/2023 1458    MCHC 33.8 08/27/2023 1703   RDW 12.0 09/14/2023 1458   LYMPHSABS 1.3 09/14/2023 1458   MONOABS 1.1 (H) 08/27/2023 1703   EOSABS 0.4 09/14/2023 1458   BASOSABS 0.1 09/14/2023 1458   Recent Labs    08/27/23 1703 08/31/23 1421 09/14/23 1458  HGB 13.5 13.2 11.4    CMP     Component Value Date/Time   NA 136 08/27/2023 1703   K 3.5 08/27/2023 1703   CL 108 08/27/2023 1703   CO2 25 08/27/2023 1703   GLUCOSE 97 08/27/2023 1703   BUN 8 08/27/2023 1703   CREATININE 0.78 08/27/2023 1703   CREATININE 0.72 06/15/2020 1201   CALCIUM 8.7 (L) 08/27/2023 1703   PROT 6.4 (L) 08/27/2023 1703   ALBUMIN 3.7 08/27/2023 1703   AST 21 08/27/2023 1703   ALT 16 08/27/2023 1703   ALKPHOS 34 (L) 08/27/2023 1703   BILITOT 0.2 08/27/2023 1703   GFRNONAA >60 08/27/2023 1703   GFRNONAA 124 09/04/2018 0527   GFRAA 144 09/04/2018 0527      Latest Ref Rng & Units 08/27/2023    5:03 PM 06/15/2020   12:01 PM 09/04/2018    5:27 AM  Hepatic Function  Total Protein 6.5 - 8.1 g/dL 6.4  7.3  6.9   Albumin 3.5 - 5.0 g/dL 3.7     AST 15 - 41 U/L 21  17  17    ALT 0 - 44 U/L 16  11  12    Alk Phosphatase 38 - 126 U/L 34     Total Bilirubin <1.2 mg/dL 0.2  0.9  0.4       Current Medications:        Current Outpatient Medications (Other):    amphetamine -dextroamphetamine  (ADDERALL) 20 MG tablet, Take 1 tablet (20 mg total) by mouth daily.   escitalopram  (LEXAPRO ) 10 MG tablet, Take 1 tablet (10 mg total) by mouth daily. (Patient not taking: Reported on 12/23/2023)   sulfaSALAzine  (AZULFIDINE ) 500 MG tablet, Take 1 tablet (500 mg total) by mouth 4 (four) times daily.  Medical History:  Past Medical History:  Diagnosis Date   ADD (attention deficit disorder) 05/2018   Anxiety    UC (ulcerative colitis) (HCC)    Allergies:  Allergies  Allergen Reactions   Other     apples     Surgical History:  She  has a past surgical history that includes Wisdom tooth extraction; Colonoscopy with propofol   (N/A, 09/02/2023); and biopsy (09/02/2023). Family History:  Her family history includes Anxiety disorder in her mother; BRCA 1/2 in her mother; Healthy in her father.  REVIEW OF SYSTEMS  : All other systems reviewed and negative except where noted in the History of Present Illness.  PHYSICAL EXAM: There were no vitals taken for this visit. General Appearance: Well nourished, in no apparent distress. Head:   Normocephalic and atraumatic. Eyes:  sclerae anicteric,conjunctive pink  Respiratory: Respiratory effort normal, BS equal bilaterally without rales, rhonchi, wheezing. Cardio: RRR with no MRGs. Peripheral pulses intact.  Abdomen: Soft,  {BlankSingle:19197::"Flat","Obese","Non-distended"} ,active bowel sounds. {actendernessAB:27319} tenderness {anatomy; site abdomen:5010}. {BlankMultiple:19196::"Without guarding","With guarding","Without rebound","With rebound"}. No masses. Rectal: {acrectalexam:27461} Musculoskeletal: Full ROM, {PSY - GAIT AND STATION:22860} gait. {With/Without:304960234} edema. Skin:  Dry and intact without significant lesions or rashes Neuro: Alert and  oriented x4;  No  focal deficits. Psych:  Cooperative. Normal mood and affect.    Edmonia Gottron, PA-C 8:13 AM

## 2024-03-14 ENCOUNTER — Other Ambulatory Visit (HOSPITAL_COMMUNITY): Payer: Self-pay

## 2024-08-04 ENCOUNTER — Encounter: Payer: Self-pay | Admitting: Adult Health

## 2024-08-05 ENCOUNTER — Other Ambulatory Visit (HOSPITAL_COMMUNITY): Payer: Self-pay

## 2024-08-05 ENCOUNTER — Other Ambulatory Visit: Payer: Self-pay | Admitting: Adult Health

## 2024-08-05 DIAGNOSIS — F4010 Social phobia, unspecified: Secondary | ICD-10-CM

## 2024-08-05 MED ORDER — SERTRALINE HCL 50 MG PO TABS
50.0000 mg | ORAL_TABLET | Freq: Every day | ORAL | 0 refills | Status: AC
  Filled 2024-08-05: qty 90, 90d supply, fill #0
